# Patient Record
Sex: Female | Born: 1945 | Race: White | Hispanic: No
Health system: Midwestern US, Community
[De-identification: ages and names within clinical notes are randomized; demographics above are authoritative.]

## PROBLEM LIST (undated history)

## (undated) DIAGNOSIS — I10 Essential (primary) hypertension: Secondary | ICD-10-CM

## (undated) DIAGNOSIS — M810 Age-related osteoporosis without current pathological fracture: Secondary | ICD-10-CM

## (undated) DIAGNOSIS — Z86010 Personal history of colonic polyps: Secondary | ICD-10-CM

## (undated) DIAGNOSIS — E785 Hyperlipidemia, unspecified: Secondary | ICD-10-CM

## (undated) DIAGNOSIS — K5792 Diverticulitis of intestine, part unspecified, without perforation or abscess without bleeding: Secondary | ICD-10-CM

## (undated) DIAGNOSIS — T148XXA Other injury of unspecified body region, initial encounter: Secondary | ICD-10-CM

## (undated) DIAGNOSIS — K219 Gastro-esophageal reflux disease without esophagitis: Secondary | ICD-10-CM

## (undated) DIAGNOSIS — 1 ERRONEOUS ENCOUNTER ICD10: Secondary | ICD-10-CM

## (undated) DIAGNOSIS — Z1231 Encounter for screening mammogram for malignant neoplasm of breast: Principal | ICD-10-CM

## (undated) DIAGNOSIS — E876 Hypokalemia: Secondary | ICD-10-CM

## (undated) HISTORY — PX: ABDOMINAL HYSTERECTOMY: SHX81

## (undated) HISTORY — PX: CHOLECYSTECTOMY: SHX55

## (undated) HISTORY — PX: COLONOSCOPY WITH ESOPHAGOGASTRODUODENOSCOPY (EGD): SHX5779

## (undated) HISTORY — PX: COLONOSCOPY: SHX174

## (undated) HISTORY — PX: TUBAL LIGATION: SHX77

---

## 2016-10-22 ENCOUNTER — Encounter: Payer: Self-pay | Admitting: Emergency Medicine

## 2016-10-22 ENCOUNTER — Emergency Department
Admission: EM | Admit: 2016-10-22 | Discharge: 2016-10-22 | Disposition: A | Discharge status: Home / Self Care | Payer: Medicare HMO | Attending: Emergency Medicine | Admitting: Emergency Medicine

## 2016-10-22 DIAGNOSIS — D1721 Benign lipomatous neoplasm of skin and subcutaneous tissue of right arm: Secondary | ICD-10-CM | POA: Insufficient documentation

## 2016-10-22 DIAGNOSIS — D1720 Benign lipomatous neoplasm of skin and subcutaneous tissue of unspecified limb: Secondary | ICD-10-CM

## 2016-10-22 DIAGNOSIS — D1779 Benign lipomatous neoplasm of other sites: Secondary | ICD-10-CM

## 2016-10-22 DIAGNOSIS — R2231 Localized swelling, mass and lump, right upper limb: Secondary | ICD-10-CM | POA: Diagnosis present

## 2016-10-22 NOTE — ED Provider Notes (Signed)
[facility] Emergency Department Provider Note  ____________________________________________   None    (approximate)  I have reviewed the triage vital signs and the nursing notes.   HISTORY  Chief Complaint Joint Swelling  HPI Kathleen Farmer is a 70 y.o. female who has a lump on her right proximal arm that has been there now for a couple months. Patient states that she has moved here, and does not have a doctor, but she was concerned because it had not gone away. She does not have any pain in the area and she is able to move her arm without difficulty. She also denies any numbness or tingling.   No past medical history on file.  There are no active problems to display for this patient.   No past surgical history on file.  Prior to Admission medications   Not on File    Allergies Ampicillin  No family history on file.  Social History Social History  Substance Use Topics  . Smoking status: Never Smoker  . Smokeless tobacco: Never Used  . Alcohol use No    Review of Systems Constitutional: No fever/chills Eyes: No visual changes. ENT: No sore throat. Cardiovascular: Denies chest pain. Respiratory: Denies shortness of breath. Gastrointestinal: No abdominal pain.  No nausea, no vomiting.  No diarrhea.  No constipation. Genitourinary: Negative for dysuria. Musculoskeletal: Negative for back pain. Skin: Negative for rash.Positive for a "lump" on her right proximal arm Neurological: Negative for headaches, focal weakness or numbness.  10-point ROS otherwise negative.  ____________________________________________   PHYSICAL EXAM:  VITAL SIGNS: ED Triage Vitals  Enc Vitals Group     BP 10/22/16 0715 (!) 154/92     Pulse Rate 10/22/16 0715 (!) 101     Resp 10/22/16 0715 16     Temp 10/22/16 0715 97.4 F (36.3 C)     Temp Source 10/22/16 0715 Oral     SpO2 10/22/16 0715 95 %     Weight 10/22/16 0710 129 lb (58.5 kg)     Height  10/22/16 0710 5' 1.25" (1.556 m)     Head Circumference --      Peak Flow --      Pain Score 10/22/16 0710 0     Pain Loc --      Pain Edu? --      Excl. in [place]? --     Constitutional: Alert and oriented. Well appearing and in no acute distress. Eyes: Conjunctivae are normal. PERRL. EOMI. Head: Atraumatic. Nose: No congestion/rhinnorhea. Mouth/Throat: Mucous membranes are moist.  Oropharynx non-erythematous. Neck: No stridor.   Cardiovascular: Normal rate, regular rhythm. Grossly normal heart sounds.  Good peripheral circulation. Respiratory: Normal respiratory effort.  No retractions. Lungs CTAB. Gastrointestinal: Soft and nontender. No distention. No abdominal bruits. No CVA tenderness. Musculoskeletal: No lower extremity tenderness nor edema.  No joint effusions. Patient with a 2 x 3 cm soft tissue mass right proximal lateral arm which appears to be a lipoma. There is no surrounding swelling or redness or warmth. Neurologic:  Normal speech and language. No gross focal neurologic deficits are appreciated. No gait instability. Skin:  Skin is warm, dry and intact. No rash noted. Psychiatric: Mood and affect are normal. Speech and behavior are normal.  ____________________________________________   LABS (all labs ordered are listed, but only abnormal results are displayed)  Labs Reviewed - No data to display ____________________________________________  EKG None  ____________________________________________  RADIOLOGY  None ____________________________________________   PROCEDURES  Procedure(s) performed: None  Procedures  Critical Care performed: No  ____________________________________________   INITIAL IMPRESSION / ASSESSMENT AND PLAN / ED COURSE  Pertinent labs & imaging results that were available during my care of the patient were reviewed by me and considered in my medical decision making (see chart for details).  Pt will be referred to general surgery  to have her lipoma removed if she desires.  Pt was told to otherwise follow up with primary care.  Clinical Course      ____________________________________________   FINAL CLINICAL IMPRESSION(S) / ED DIAGNOSES  Final diagnoses:  Lipoma of extremity      NEW MEDICATIONS STARTED DURING THIS VISIT:  New Prescriptions   No medications on file     Note:  This document was prepared using Dragon voice recognition software and may include unintentional dictation errors.    Ruby Cola, MD 10/22/16 [phone]

## 2016-10-22 NOTE — ED Triage Notes (Signed)
Patient presents to the ED with swollen area to her right shoulder x 2 months.  Patient denies pain at this time.  Patient states, "I thought this lump was from exercising but it's not going away."

## 2017-08-08 ENCOUNTER — Encounter: Payer: Self-pay | Admitting: *Deleted

## 2017-08-09 ENCOUNTER — Encounter
Admission: RE | Disposition: A | Discharge status: Home / Self Care | Payer: Self-pay | Source: Ambulatory Visit | Attending: Unknown Physician Specialty

## 2017-08-09 ENCOUNTER — Ambulatory Visit: Payer: Medicare HMO | Admitting: Anesthesiology

## 2017-08-09 ENCOUNTER — Encounter: Payer: Self-pay | Admitting: *Deleted

## 2017-08-09 ENCOUNTER — Ambulatory Visit
Admission: RE | Admit: 2017-08-09 | Discharge: 2017-08-09 | Disposition: A | Discharge status: Home / Self Care | Payer: Medicare HMO | Source: Ambulatory Visit | Attending: Unknown Physician Specialty | Admitting: Unknown Physician Specialty

## 2017-08-09 DIAGNOSIS — K219 Gastro-esophageal reflux disease without esophagitis: Secondary | ICD-10-CM | POA: Insufficient documentation

## 2017-08-09 DIAGNOSIS — Z79899 Other long term (current) drug therapy: Secondary | ICD-10-CM | POA: Insufficient documentation

## 2017-08-09 DIAGNOSIS — Z881 Allergy status to other antibiotic agents status: Secondary | ICD-10-CM | POA: Insufficient documentation

## 2017-08-09 DIAGNOSIS — Z90710 Acquired absence of both cervix and uterus: Secondary | ICD-10-CM | POA: Diagnosis not present

## 2017-08-09 DIAGNOSIS — D122 Benign neoplasm of ascending colon: Secondary | ICD-10-CM | POA: Insufficient documentation

## 2017-08-09 DIAGNOSIS — Z9049 Acquired absence of other specified parts of digestive tract: Secondary | ICD-10-CM | POA: Insufficient documentation

## 2017-08-09 DIAGNOSIS — M810 Age-related osteoporosis without current pathological fracture: Secondary | ICD-10-CM | POA: Insufficient documentation

## 2017-08-09 DIAGNOSIS — Z1211 Encounter for screening for malignant neoplasm of colon: Secondary | ICD-10-CM | POA: Diagnosis present

## 2017-08-09 DIAGNOSIS — D124 Benign neoplasm of descending colon: Secondary | ICD-10-CM | POA: Diagnosis not present

## 2017-08-09 DIAGNOSIS — D123 Benign neoplasm of transverse colon: Secondary | ICD-10-CM | POA: Insufficient documentation

## 2017-08-09 DIAGNOSIS — Z800 Family history of malignant neoplasm of digestive organs: Secondary | ICD-10-CM | POA: Diagnosis not present

## 2017-08-09 DIAGNOSIS — Z791 Long term (current) use of non-steroidal anti-inflammatories (NSAID): Secondary | ICD-10-CM | POA: Diagnosis not present

## 2017-08-09 DIAGNOSIS — Z86010 Personal history of colonic polyps: Secondary | ICD-10-CM | POA: Diagnosis not present

## 2017-08-09 HISTORY — DX: Age-related osteoporosis without current pathological fracture: M81.0

## 2017-08-09 HISTORY — DX: Other injury of unspecified body region, initial encounter: T14.8XXA

## 2017-08-09 HISTORY — DX: Personal history of colonic polyps: Z86.010

## 2017-08-09 HISTORY — PX: COLONOSCOPY WITH PROPOFOL: SHX5780

## 2017-08-09 HISTORY — DX: Gastro-esophageal reflux disease without esophagitis: K21.9

## 2017-08-09 SURGERY — COLONOSCOPY WITH PROPOFOL
Anesthesia: General

## 2017-08-09 MED ORDER — PROPOFOL 10 MG/ML IV BOLUS: INTRAVENOUS | Status: DC | PRN

## 2017-08-09 MED ORDER — SODIUM CHLORIDE 0.9 % IV SOLN
INTRAVENOUS | Status: DC
  Administered 2017-08-09: 14:00:00 via INTRAVENOUS

## 2017-08-09 MED ORDER — PROPOFOL 10 MG/ML IV BOLUS
INTRAVENOUS | Status: AC
  Filled 2017-08-09: qty 20

## 2017-08-09 MED ORDER — PROPOFOL 500 MG/50ML IV EMUL
INTRAVENOUS | Status: AC
  Filled 2017-08-09: qty 50

## 2017-08-09 MED ORDER — PROPOFOL 500 MG/50ML IV EMUL
INTRAVENOUS | Status: DC | PRN
  Administered 2017-08-09: 120 ug/kg/min via INTRAVENOUS

## 2017-08-09 MED ORDER — LIDOCAINE HCL (PF) 2 % IJ SOLN
INTRAMUSCULAR | Status: AC
Start: 2017-08-09 — End: 2017-08-09
  Filled 2017-08-09: qty 2

## 2017-08-09 MED ORDER — SODIUM CHLORIDE 0.9 % IV SOLN: INTRAVENOUS | Status: DC

## 2017-08-09 MED ORDER — PROPOFOL 10 MG/ML IV BOLUS
INTRAVENOUS | Status: DC | PRN
  Administered 2017-08-09: 60 mg via INTRAVENOUS

## 2017-08-09 NOTE — Op Note (Signed)
[facility] Patient Name: Kathleen Farmer Procedure Date: 08/09/2017 2:54 PM MRN: [id number] Account #: [id number] Date of Birth: 08/24/1946 Admit Type: Outpatient Age: 71 Room: [facility] ENDO ROOM 3 Gender: Female Note Status: Finalized Procedure:            Colonoscopy Indications:          Family history of colon cancer in a first-degree                        relative Providers:            Manya Silvas, MD Referring MD:         Dion Body (Referring MD) Medicines:            Propofol per Anesthesia Complications:        No immediate complications. Procedure:            Pre-Anesthesia Assessment:                       - After reviewing the risks and benefits, the patient                        was deemed in satisfactory condition to undergo the                        procedure.                       After obtaining informed consent, the colonoscope was                        passed under direct vision. Throughout the procedure,                        the patient's blood pressure, pulse, and oxygen                        saturations were monitored continuously. The                        Colonoscope was introduced through the anus and                        advanced to the the cecum, identified by appendiceal                        orifice and ileocecal valve. The colonoscopy was                        performed without difficulty. The patient tolerated the                        procedure well. The quality of the bowel preparation                        was excellent. Findings:      A small polyp was found in the ascending colon. The polyp was sessile.       The polyp was removed with a hot snare. Resection and retrieval were       complete. To prevent bleeding after the polypectomy, one hemostatic clip  was successfully placed. There was no bleeding at the end of the       procedure.      A small polyp was found in the  transverse colon. The polyp was sessile.       The polyp was removed with a hot snare. Resection and retrieval were       complete. To prevent bleeding after the polypectomy, one hemostatic clip       was successfully placed. There was no bleeding during, or at the end, of       the procedure.      A small polyp was found in the descending colon. The polyp was sessile.       The polyp was removed with a hot snare. Resection and retrieval were       complete. To stop active bleeding, two hemostatic clips were       successfully placed. There was no bleeding at the end of the procedure. Impression:           - One small polyp in the ascending colon, removed with                        a hot snare. Resected and retrieved. Clip was placed.                       - One small polyp in the transverse colon, removed with                        a hot snare. Resected and retrieved. Clip was placed.                       - One small polyp in the descending colon, removed with                        a hot snare. Resected and retrieved. Clips were placed. Recommendation:       - Await pathology results. Manya Silvas, MD 08/09/2017 3:29:55 PM This report has been signed electronically. Number of Addenda: 0 Note Initiated On: 08/09/2017 2:54 PM Scope Withdrawal Time: 0 hours 19 minutes 58 seconds  Total Procedure Duration: 0 hours 24 minutes 51 seconds       [facility]

## 2017-08-09 NOTE — H&P (Signed)
   Primary Care Physician:  Dion Body, MD Primary Gastroenterologist:  Dr. Vira Agar  Pre-Procedure History & Physical: HPI:  Kathleen Farmer is a 71 y.o. female is here for an colonoscopy.   Past Medical History:  Diagnosis Date  . Fx   . GERD (gastroesophageal reflux disease)   . Hx of colonic polyps   . Osteoporosis     Past Surgical History:  Procedure Laterality Date  . ABDOMINAL HYSTERECTOMY    . CHOLECYSTECTOMY    . COLONOSCOPY    . COLONOSCOPY WITH ESOPHAGOGASTRODUODENOSCOPY (EGD)      Prior to Admission medications   Medication Sig Start Date End Date Taking? Authorizing Provider  Ca Cit Malate-Cholecalciferol (CALCIUM CITRATE MALATE-VIT D PO) Take 1,200 mg by mouth.   Yes [provider]  lansoprazole (PREVACID) 15 MG capsule Take 15 mg by mouth daily at 12 noon.   Yes [provider]  alendronate (FOSAMAX) 70 MG tablet Take 70 mg by mouth once a week. Take with a full glass of water on an empty stomach.    [provider]  meloxicam (MOBIC) 15 MG tablet Take 15 mg by mouth daily.    [provider]    Allergies as of 05/09/2017 - never reviewed  Allergen Reaction Noted  . Ampicillin Other (See Comments) 10/22/2016    History reviewed. No pertinent family history.  Social History   Social History  . Marital status: Married    Spouse name: N/A  . Number of children: N/A  . Years of education: N/A   Occupational History  . Not on file.   Social History Main Topics  . Smoking status: Never Smoker  . Smokeless tobacco: Never Used  . Alcohol use No  . Drug use: No  . Sexual activity: Not on file   Other Topics Concern  . Not on file   Social History Narrative  . No narrative on file    Review of Systems: See HPI, otherwise negative ROS  Physical Exam: BP 140/85   Pulse 99   Temp (!) 96.4 F (35.8 C) (Tympanic)   Resp 18   Ht 5\' 1"  (1.549 m)   Wt 63 kg (139 lb)   LMP  (LMP Unknown)   SpO2 100%    BMI 26.26 kg/m  General:   Alert,  pleasant and cooperative in NAD Head:  Normocephalic and atraumatic. Neck:  Supple; no masses or thyromegaly. Lungs:  Clear throughout to auscultation.    Heart:  Regular rate and rhythm. Abdomen:  Soft, nontender and nondistended. Normal bowel sounds, without guarding, and without rebound.   Neurologic:  Alert and  oriented x4;  grossly normal neurologically.  Impression/Plan: Kathleen Farmer is here for an colonoscopy to be performed for Family history of colon cancer in father and sister.  Risks, benefits, limitations, and alternatives regarding  colonoscopy have been reviewed with the patient.  Questions have been answered.  All parties agreeable.   Gaylyn Cheers, MD  08/09/2017, 2:54 PM

## 2017-08-09 NOTE — Anesthesia Preprocedure Evaluation (Signed)
Anesthesia Evaluation  Patient identified by MRN, date of birth, ID band Patient awake    Reviewed: Allergy & Precautions, NPO status , Patient's Chart, lab work & pertinent test results  Airway Mallampati: II       Dental  (+) Upper Dentures, Lower Dentures   Pulmonary neg pulmonary ROS,    breath sounds clear to auscultation       Cardiovascular Exercise Tolerance: Good  Rhythm:Regular     Neuro/Psych negative neurological ROS  negative psych ROS   GI/Hepatic Neg liver ROS, PUD, GERD  Medicated,  Endo/Other  negative endocrine ROS  Renal/GU negative Renal ROS     Musculoskeletal negative musculoskeletal ROS (+)   Abdominal Normal abdominal exam  (+)   Peds negative pediatric ROS (+)  Hematology negative hematology ROS (+)   Anesthesia Other Findings   Reproductive/Obstetrics negative OB ROS                             Anesthesia Physical Anesthesia Plan  ASA: II  Anesthesia Plan: General   Post-op Pain Management:    Induction: Intravenous  PONV Risk Score and Plan: 0  Airway Management Planned: Natural Airway and Nasal Cannula  Additional Equipment:   Intra-op Plan:   Post-operative Plan:   Informed Consent: I have reviewed the patients History and Physical, chart, labs and discussed the procedure including the risks, benefits and alternatives for the proposed anesthesia with the patient or authorized representative who has indicated his/her understanding and acceptance.     Plan Discussed with: CRNA  Anesthesia Plan Comments:         Anesthesia Quick Evaluation

## 2017-08-09 NOTE — Transfer of Care (Signed)
Immediate Anesthesia Transfer of Care Note  Patient: Kathleen Farmer  Procedure(s) Performed: Procedure(s): COLONOSCOPY WITH PROPOFOL (N/A)  Patient Location: PACU  Anesthesia Type:General  Level of Consciousness: sedated  Airway & Oxygen Therapy: Patient Spontanous Breathing and Patient connected to nasal cannula oxygen  Post-op Assessment: Report given to RN and Post -op Vital signs reviewed and stable  Post vital signs: Reviewed and stable  Last Vitals:  Vitals:   08/09/17 1329 08/09/17 1528  BP: 140/85 99/62  Pulse: 99 62  Resp: 18 16  Temp: (!) 35.8 C (!) 36 C  SpO2: 100% 99    Last Pain:  Vitals:   08/09/17 1528  TempSrc: Tympanic         Complications: No apparent anesthesia complications

## 2017-08-09 NOTE — Anesthesia Postprocedure Evaluation (Signed)
Anesthesia Post Note  Patient: Kathleen Farmer  Procedure(s) Performed: Procedure(s) (LRB): COLONOSCOPY WITH PROPOFOL (N/A)  Patient location during evaluation: PACU Anesthesia Type: General Level of consciousness: awake and alert and oriented Pain management: pain level controlled Vital Signs Assessment: post-procedure vital signs reviewed and stable Respiratory status: spontaneous breathing Cardiovascular status: blood pressure returned to baseline Anesthetic complications: no     Last Vitals:  Vitals:   08/09/17 1538 08/09/17 1558  BP: 113/76 138/74  Pulse:    Resp:  16  Temp:    SpO2:      Last Pain:  Vitals:   08/09/17 1528  TempSrc: Tympanic                 [name]

## 2017-08-09 NOTE — Anesthesia Post-op Follow-up Note (Signed)
Anesthesia QCDR form completed.        

## 2017-08-10 ENCOUNTER — Encounter: Payer: Self-pay | Admitting: Unknown Physician Specialty

## 2017-08-12 LAB — SURGICAL PATHOLOGY

## 2017-08-15 ENCOUNTER — Encounter: Payer: Self-pay | Admitting: Unknown Physician Specialty

## 2018-08-22 ENCOUNTER — Other Ambulatory Visit: Payer: Self-pay | Admitting: Family Medicine

## 2018-08-22 DIAGNOSIS — Z1231 Encounter for screening mammogram for malignant neoplasm of breast: Secondary | ICD-10-CM

## 2018-09-04 ENCOUNTER — Ambulatory Visit
Admission: RE | Admit: 2018-09-04 | Discharge: 2018-09-04 | Disposition: A | Discharge status: Home / Self Care | Payer: Medicare HMO | Source: Ambulatory Visit | Attending: Family Medicine | Admitting: Family Medicine

## 2018-09-04 DIAGNOSIS — Z1231 Encounter for screening mammogram for malignant neoplasm of breast: Secondary | ICD-10-CM | POA: Diagnosis present

## 2018-09-14 ENCOUNTER — Inpatient Hospital Stay
Admission: RE | Admit: 2018-09-14 | Discharge: 2018-09-14 | Disposition: A | Discharge status: Home / Self Care | Payer: Self-pay | Source: Ambulatory Visit | Attending: *Deleted | Admitting: *Deleted

## 2018-09-14 ENCOUNTER — Other Ambulatory Visit: Payer: Self-pay | Admitting: *Deleted

## 2018-09-14 DIAGNOSIS — Z9289 Personal history of other medical treatment: Secondary | ICD-10-CM

## 2019-07-22 ENCOUNTER — Other Ambulatory Visit: Payer: Self-pay | Admitting: Family Medicine

## 2019-07-22 DIAGNOSIS — Z1231 Encounter for screening mammogram for malignant neoplasm of breast: Secondary | ICD-10-CM

## 2019-09-06 ENCOUNTER — Ambulatory Visit
Admission: RE | Admit: 2019-09-06 | Discharge: 2019-09-06 | Disposition: A | Discharge status: Home / Self Care | Payer: Medicare HMO | Source: Ambulatory Visit | Attending: Family Medicine | Admitting: Family Medicine

## 2019-09-06 DIAGNOSIS — Z1231 Encounter for screening mammogram for malignant neoplasm of breast: Secondary | ICD-10-CM

## 2020-02-10 IMAGING — MG MM DIGITAL SCREENING BILAT W/ TOMO W/ CAD
8 series · 8 of 24 positions shown · non-contrast
Comparison: Previous exam(s).

CLINICAL DATA: Screening.

EXAM:
DIGITAL SCREENING BILATERAL MAMMOGRAM WITH TOMO AND CAD

[R MLO synth-2D]
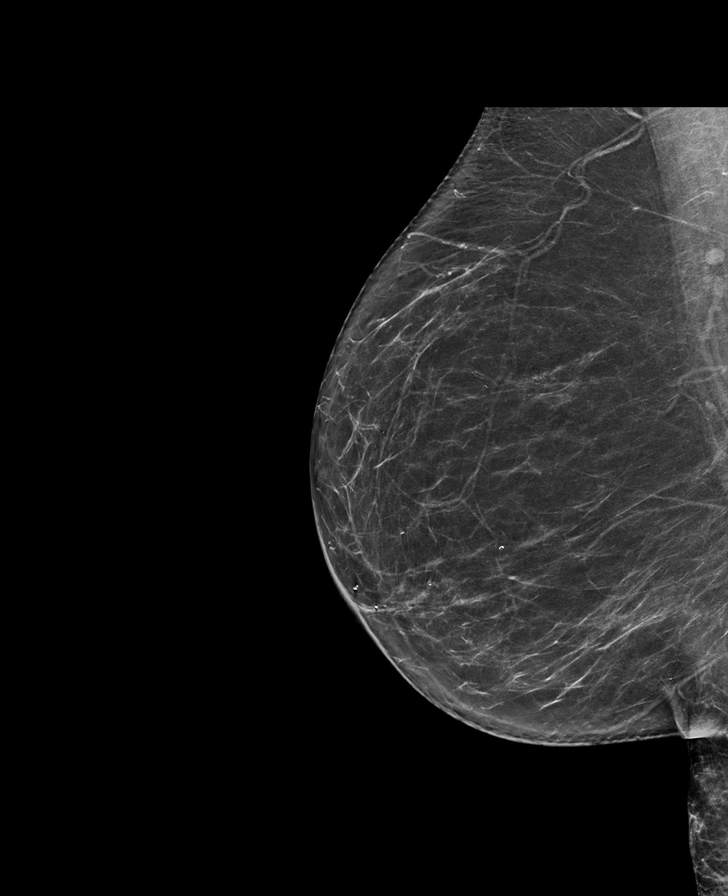

[R CC synth-2D]
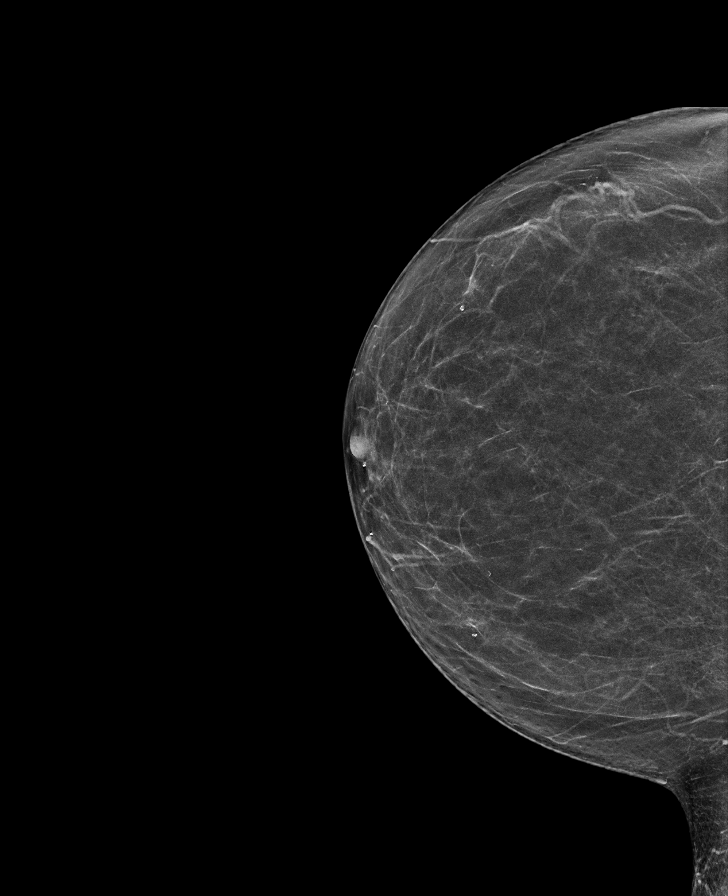

[L CC synth-2D]
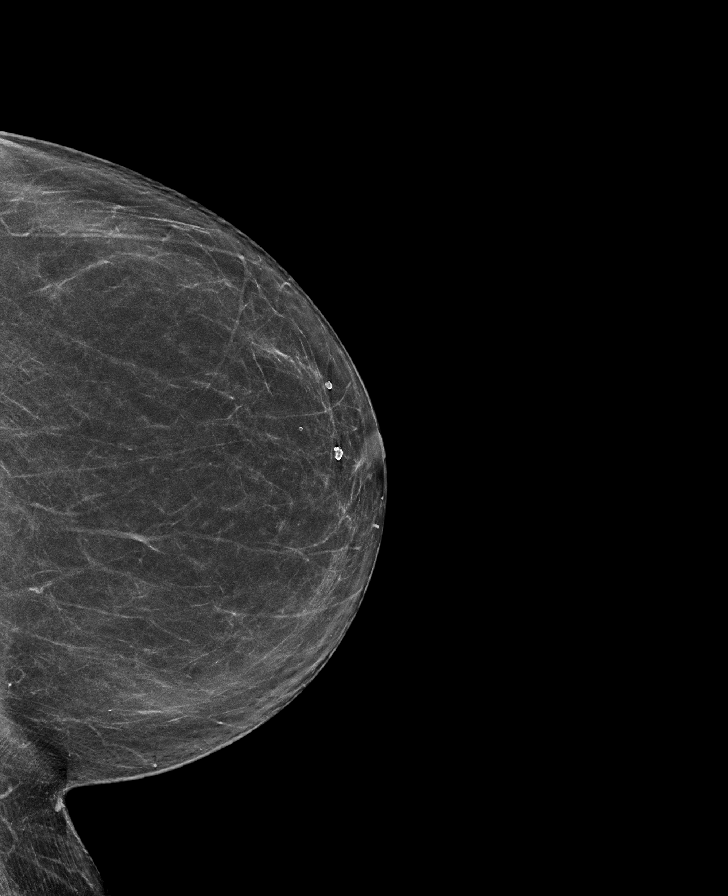

[L MLO synth-2D]
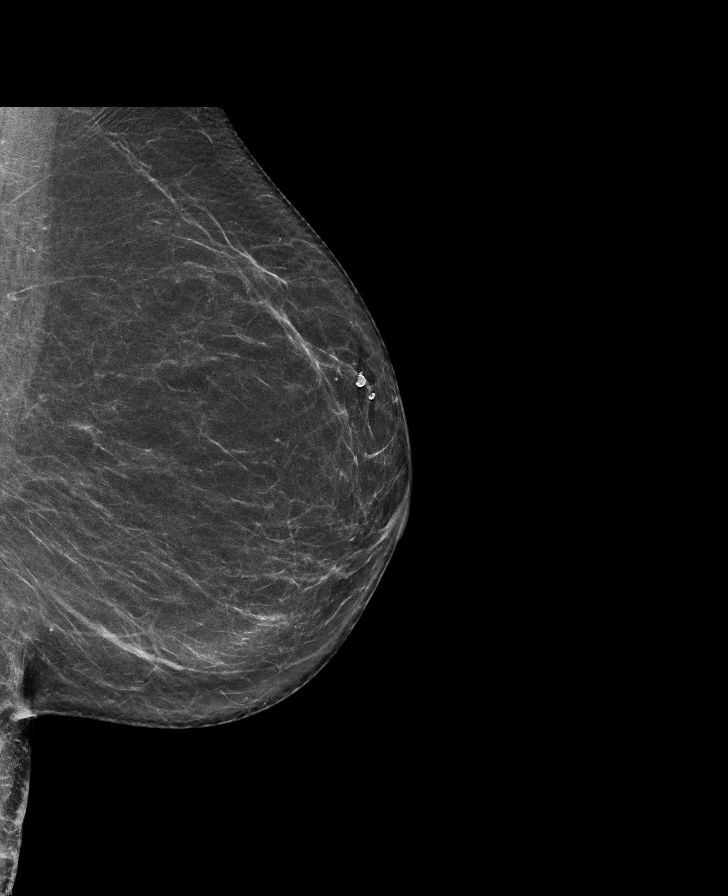

[R MLO tomo · tomo slice 38/75.0]
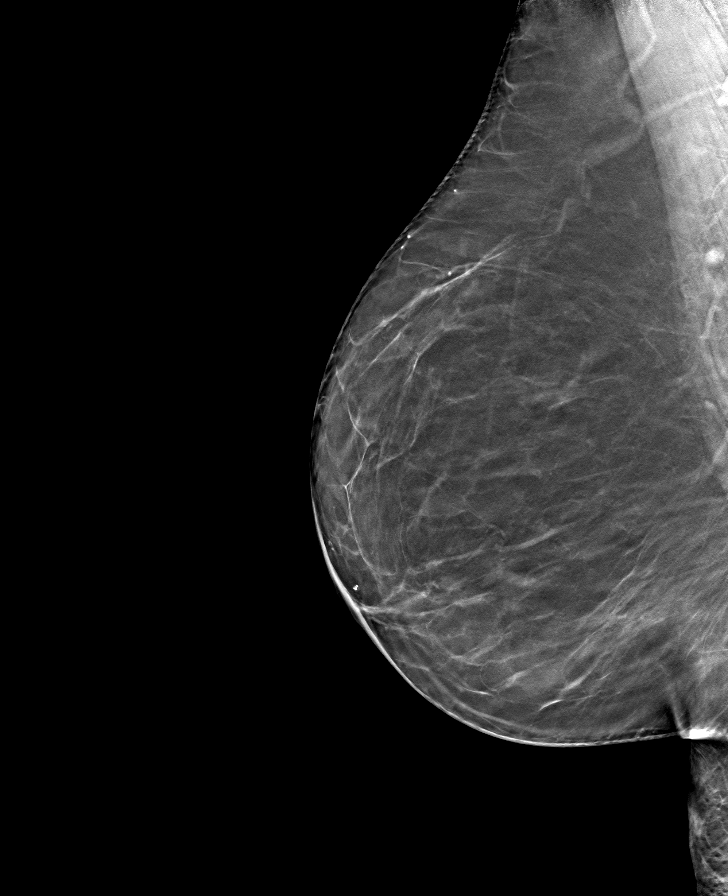

[L CC tomo · tomo slice 34/67.0]
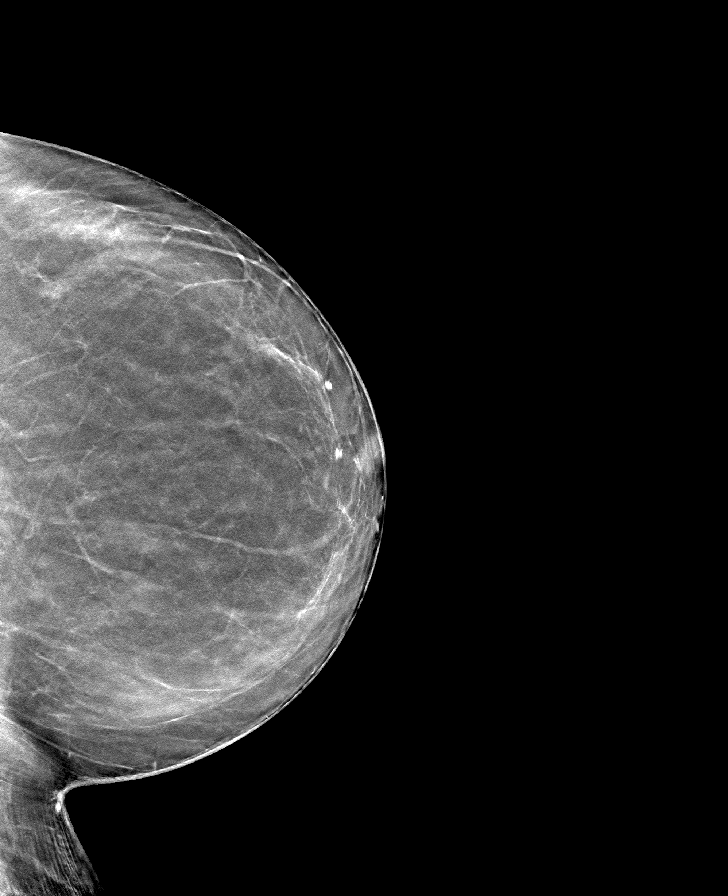

[R CC tomo · tomo slice 32/63.0]
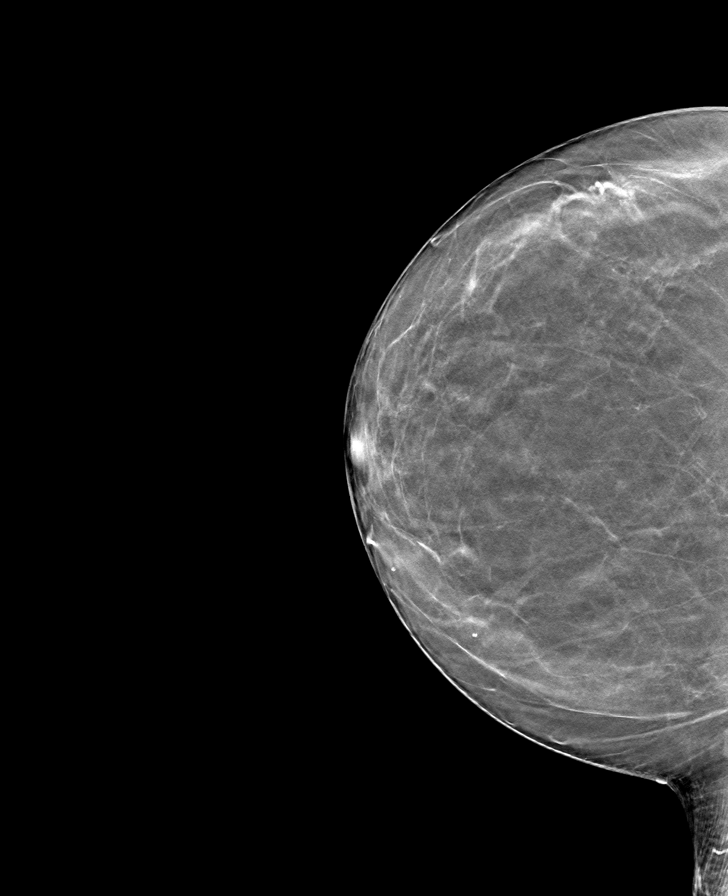

[L MLO tomo · tomo slice 37/72.0]
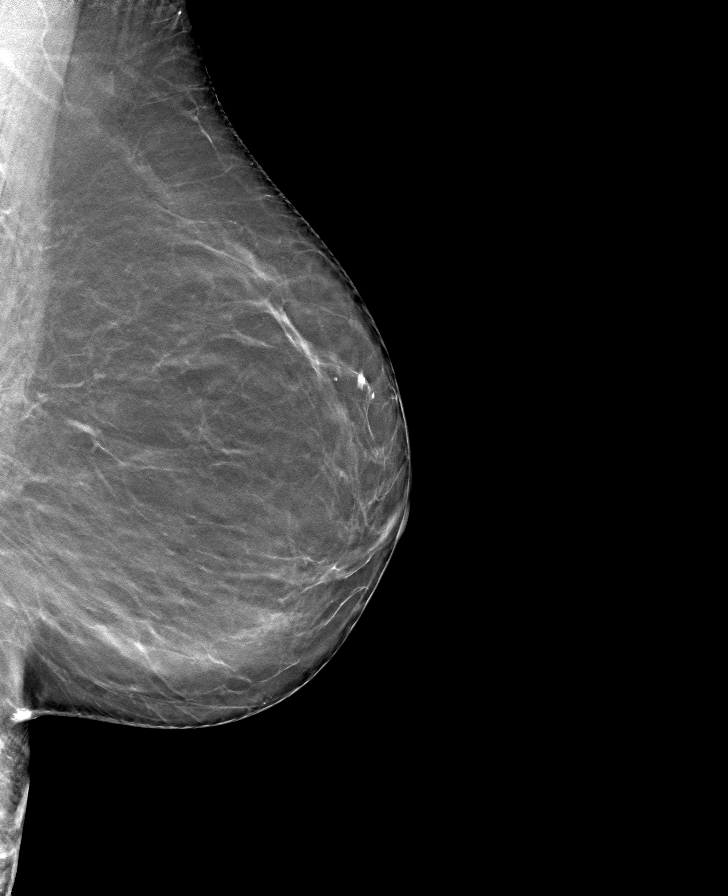

[8 of 24 positions shown; findings below may reference images not displayed]

ACR Breast Density Category b: There are scattered areas of
fibroglandular density.
FINDINGS: There are no findings suspicious for malignancy. Images were
processed with CAD.
IMPRESSION: No mammographic evidence of malignancy. A result letter of this
screening mammogram will be mailed directly to the patient.

RECOMMENDATION:
Screening mammogram in one year. (Code:[id number]-775)

BI-RADS CATEGORY  1: Negative.

## 2020-07-23 ENCOUNTER — Other Ambulatory Visit: Payer: Self-pay | Admitting: Family Medicine

## 2020-07-23 DIAGNOSIS — Z1231 Encounter for screening mammogram for malignant neoplasm of breast: Secondary | ICD-10-CM

## 2020-09-07 ENCOUNTER — Other Ambulatory Visit: Payer: Self-pay

## 2020-09-07 ENCOUNTER — Ambulatory Visit
Admission: RE | Admit: 2020-09-07 | Discharge: 2020-09-07 | Disposition: A | Discharge status: Home / Self Care | Payer: Medicare HMO | Source: Ambulatory Visit | Attending: Family Medicine | Admitting: Family Medicine

## 2020-09-07 DIAGNOSIS — Z1231 Encounter for screening mammogram for malignant neoplasm of breast: Secondary | ICD-10-CM | POA: Diagnosis present

## 2021-01-15 ENCOUNTER — Other Ambulatory Visit: Payer: Self-pay

## 2021-01-15 ENCOUNTER — Other Ambulatory Visit
Admission: RE | Admit: 2021-01-15 | Discharge: 2021-01-15 | Disposition: A | Discharge status: Home / Self Care | Payer: Medicare HMO | Source: Ambulatory Visit | Attending: Gastroenterology | Admitting: Gastroenterology

## 2021-01-15 DIAGNOSIS — Z01812 Encounter for preprocedural laboratory examination: Secondary | ICD-10-CM | POA: Diagnosis present

## 2021-01-15 DIAGNOSIS — Z20822 Contact with and (suspected) exposure to covid-19: Secondary | ICD-10-CM | POA: Insufficient documentation

## 2021-01-15 LAB — SARS CORONAVIRUS 2 (TAT 6-24 HRS): SARS Coronavirus 2: NEGATIVE

## 2021-01-19 ENCOUNTER — Encounter: Payer: Self-pay | Admitting: *Deleted

## 2021-01-19 ENCOUNTER — Ambulatory Visit: Payer: Medicare HMO | Admitting: Certified Registered Nurse Anesthetist

## 2021-01-19 ENCOUNTER — Encounter
Admission: RE | Disposition: A | Discharge status: Home / Self Care | Payer: Self-pay | Source: Home / Self Care | Attending: Gastroenterology

## 2021-01-19 ENCOUNTER — Ambulatory Visit
Admission: RE | Admit: 2021-01-19 | Discharge: 2021-01-19 | Disposition: A | Discharge status: Home / Self Care | Payer: Medicare HMO | Attending: Gastroenterology | Admitting: Gastroenterology

## 2021-01-19 ENCOUNTER — Other Ambulatory Visit: Payer: Self-pay

## 2021-01-19 DIAGNOSIS — Z86010 Personal history of colonic polyps: Secondary | ICD-10-CM | POA: Insufficient documentation

## 2021-01-19 DIAGNOSIS — Z800 Family history of malignant neoplasm of digestive organs: Secondary | ICD-10-CM | POA: Diagnosis not present

## 2021-01-19 DIAGNOSIS — Z880 Allergy status to penicillin: Secondary | ICD-10-CM | POA: Insufficient documentation

## 2021-01-19 DIAGNOSIS — Z791 Long term (current) use of non-steroidal anti-inflammatories (NSAID): Secondary | ICD-10-CM | POA: Diagnosis not present

## 2021-01-19 DIAGNOSIS — Z1211 Encounter for screening for malignant neoplasm of colon: Secondary | ICD-10-CM | POA: Diagnosis not present

## 2021-01-19 DIAGNOSIS — Z79899 Other long term (current) drug therapy: Secondary | ICD-10-CM | POA: Diagnosis not present

## 2021-01-19 DIAGNOSIS — K5730 Diverticulosis of large intestine without perforation or abscess without bleeding: Secondary | ICD-10-CM | POA: Insufficient documentation

## 2021-01-19 DIAGNOSIS — K640 First degree hemorrhoids: Secondary | ICD-10-CM | POA: Diagnosis not present

## 2021-01-19 HISTORY — PX: COLONOSCOPY WITH PROPOFOL: SHX5780

## 2021-01-19 HISTORY — DX: Essential (primary) hypertension: I10

## 2021-01-19 HISTORY — DX: Diverticulitis of intestine, part unspecified, without perforation or abscess without bleeding: K57.92

## 2021-01-19 HISTORY — DX: Hyperlipidemia, unspecified: E78.5

## 2021-01-19 SURGERY — COLONOSCOPY WITH PROPOFOL
Anesthesia: General

## 2021-01-19 MED ORDER — PROPOFOL 10 MG/ML IV BOLUS
INTRAVENOUS | Status: DC | PRN
  Administered 2021-01-19: 40 mg via INTRAVENOUS

## 2021-01-19 MED ORDER — PROPOFOL 500 MG/50ML IV EMUL
INTRAVENOUS | Status: DC | PRN
  Administered 2021-01-19: 125 ug/kg/min via INTRAVENOUS

## 2021-01-19 MED ORDER — SODIUM CHLORIDE 0.9 % IV SOLN: INTRAVENOUS | Status: DC

## 2021-01-19 MED ORDER — PHENYLEPHRINE HCL (PRESSORS) 10 MG/ML IV SOLN
INTRAVENOUS | Status: DC | PRN
  Administered 2021-01-19: 100 ug via INTRAVENOUS

## 2021-01-19 MED ORDER — PROPOFOL 10 MG/ML IV BOLUS
INTRAVENOUS | Status: AC
  Filled 2021-01-19: qty 20

## 2021-01-19 MED ORDER — PROPOFOL 500 MG/50ML IV EMUL
INTRAVENOUS | Status: AC
  Filled 2021-01-19: qty 50

## 2021-01-19 MED ORDER — LIDOCAINE HCL (CARDIAC) PF 100 MG/5ML IV SOSY
PREFILLED_SYRINGE | INTRAVENOUS | Status: DC | PRN
  Administered 2021-01-19: 50 mg via INTRAVENOUS

## 2021-01-19 NOTE — H&P (Signed)
Outpatient short stay form Pre-procedure 01/19/2021 10:16 AM Kathleen Miyamoto MD, MPH  Primary Physician: Dr. Netty Starring  Reason for visit:  Surveillance  History of present illness:   75 y/o lady with history of adenomatous polyps found three years ago here for surveillance colonoscopy. Father had colon cancer at around 23. History of hysterectomy but no other GI surgeries. No blood thinners. No new GI symptoms.    Current Facility-Administered Medications:  .  0.9 %  sodium chloride infusion, , Intravenous, Continuous, Teela Narducci, Hilton Cork, MD, Last Rate: 20 mL/hr at 01/19/21 1016, New Bag at 01/19/21 1016  Medications Prior to Admission  Medication Sig Dispense Refill Last Dose  . Ca Cit Malate-Cholecalciferol (CALCIUM CITRATE MALATE-VIT D PO) Take 1,200 mg by mouth.   01/18/2021 at 0800  . ezetimibe (ZETIA) 10 MG tablet Take 10 mg by mouth daily.   01/18/2021 at 0800  . lansoprazole (PREVACID) 15 MG capsule Take 15 mg by mouth daily at 12 noon.   01/18/2021 at Unknown time  . meloxicam (MOBIC) 15 MG tablet Take 15 mg by mouth daily.   Past Week at Unknown time  . omeprazole (PRILOSEC) 20 MG capsule Take 20 mg by mouth daily.   01/18/2021 at 0800  . alendronate (FOSAMAX) 70 MG tablet Take 70 mg by mouth once a week. Take with a full glass of water on an empty stomach. (Patient not taking: Reported on 01/19/2021)   Completed Course at Unknown time     Allergies  Allergen Reactions  . Ampicillin Other (See Comments)    unknown     Past Medical History:  Diagnosis Date  . Diverticulitis   . Fx   . GERD (gastroesophageal reflux disease)   . Hx of colonic polyps   . Hyperlipidemia   . Hypertension   . Osteoporosis   . Osteoporosis     Review of systems:  Otherwise negative.    Physical Exam  Gen: Alert, oriented. Appears stated age.  HEENT: PERRLA. Lungs: No respiratory distress CV: RRR Abd: soft, benign, no masses Ext: No edema    Planned procedures: Proceed with  colonoscopy. The patient understands the nature of the planned procedure, indications, risks, alternatives and potential complications including but not limited to bleeding, infection, perforation, damage to internal organs and possible oversedation/side effects from anesthesia. The patient agrees and gives consent to proceed.  Please refer to procedure notes for findings, recommendations and patient disposition/instructions.     Kathleen Miyamoto MD, MPH Gastroenterology 01/19/2021  10:16 AM

## 2021-01-19 NOTE — Anesthesia Preprocedure Evaluation (Signed)
Anesthesia Evaluation  Patient identified by MRN, date of birth, ID band Patient awake    Reviewed: Allergy & Precautions, H&P , NPO status , Patient's Chart, lab work & pertinent test results  History of Anesthesia Complications Negative for: history of anesthetic complications  Airway Mallampati: II       Dental   Pulmonary neg pulmonary ROS, neg sleep apnea, neg COPD,    breath sounds clear to auscultation       Cardiovascular hypertension, (-) angina(-) Past MI and (-) Cardiac Stents (-) dysrhythmias  Rhythm:regular     Neuro/Psych negative neurological ROS  negative psych ROS   GI/Hepatic Neg liver ROS, GERD  Controlled,  Endo/Other  negative endocrine ROS  Renal/GU negative Renal ROS  negative genitourinary   Musculoskeletal   Abdominal   Peds  Hematology negative hematology ROS (+)   Anesthesia Other Findings Past Medical History: No date: Diverticulitis No date: Fx No date: GERD (gastroesophageal reflux disease) No date: Hx of colonic polyps No date: Hyperlipidemia No date: Hypertension No date: Osteoporosis No date: Osteoporosis  Past Surgical History: No date: ABDOMINAL HYSTERECTOMY No date: CHOLECYSTECTOMY No date: COLONOSCOPY No date: COLONOSCOPY WITH ESOPHAGOGASTRODUODENOSCOPY (EGD) 08/09/2017: COLONOSCOPY WITH PROPOFOL; N/A     Comment:  Procedure: COLONOSCOPY WITH PROPOFOL;  Surgeon: Manya Silvas, MD;  Location: [facility] ENDOSCOPY;  Service:               Endoscopy;  Laterality: N/A; No date: TUBAL LIGATION  BMI    Body Mass Index: 25.11 kg/m      Reproductive/Obstetrics negative OB ROS                             Anesthesia Physical Anesthesia Plan  ASA: II  Anesthesia Plan: General   Post-op Pain Management:    Induction:   PONV Risk Score and Plan: Propofol infusion and TIVA  Airway Management Planned: Nasal Cannula  Additional  Equipment:   Intra-op Plan:   Post-operative Plan:   Informed Consent: I have reviewed the patients History and Physical, chart, labs and discussed the procedure including the risks, benefits and alternatives for the proposed anesthesia with the patient or authorized representative who has indicated his/her understanding and acceptance.     Dental Advisory Given  Plan Discussed with: Anesthesiologist, CRNA and Surgeon  Anesthesia Plan Comments:         Anesthesia Quick Evaluation

## 2021-01-19 NOTE — Interval H&P Note (Signed)
History and Physical Interval Note:  01/19/2021 10:20 AM  Kathleen Farmer  has presented today for surgery, with the diagnosis of PH adenomatous polyps.  The various methods of treatment have been discussed with the patient and family. After consideration of risks, benefits and other options for treatment, the patient has consented to  Procedure(s): COLONOSCOPY WITH PROPOFOL (N/A) as a surgical intervention.  The patient's history has been reviewed, patient examined, no change in status, stable for surgery.  I have reviewed the patient's chart and labs.  Questions were answered to the patient's satisfaction.     [name]  Ok to proceed with colonoscopy

## 2021-01-19 NOTE — Transfer of Care (Signed)
Immediate Anesthesia Transfer of Care Note  Patient: Kathleen Farmer  Procedure(s) Performed: COLONOSCOPY WITH PROPOFOL (N/A )  Patient Location: PACU  Anesthesia Type:General  Level of Consciousness: awake, alert  and oriented  Airway & Oxygen Therapy: Patient Spontanous Breathing and Patient connected to nasal cannula oxygen  Post-op Assessment: Report given to RN and Post -op Vital signs reviewed and stable  Post vital signs: Reviewed and stable  Last Vitals:  Vitals Value Taken Time  BP    Temp    Pulse    Resp    SpO2      Last Pain:  Vitals:   01/19/21 1005  PainSc: 0-No pain         Complications: No complications documented.

## 2021-01-19 NOTE — Op Note (Signed)
[facility] Patient Name: Kathleen Farmer Procedure Date: 01/19/2021 10:23 AM MRN: [id number] Account #: [id number] Date of Birth: March 14, 1946 Admit Type: Outpatient Age: 75 Room: [facility] ENDO ROOM 1 Gender: Female Note Status: Finalized Procedure:             Colonoscopy Indications:           High risk colon cancer surveillance: Personal history                         of multiple (3 or more) adenomas, Family history of                         colon cancer in a first-degree relative before age 54                         years Providers:             Andrey Farmer MD, MD Medicines:             Monitored Anesthesia Care Complications:         No immediate complications. Procedure:             Pre-Anesthesia Assessment:                        - Prior to the procedure, a History and Physical was                         performed, and patient medications and allergies were                         reviewed. The patient is competent. The risks and                         benefits of the procedure and the sedation options and                         risks were discussed with the patient. All questions                         were answered and informed consent was obtained.                         Patient identification and proposed procedure were                         verified by the physician, the nurse, the anesthetist                         and the technician in the endoscopy suite. Mental                         Status Examination: alert and oriented. Airway                         Examination: normal oropharyngeal airway and neck                         mobility. Respiratory Examination: clear to  auscultation. CV Examination: normal. Prophylactic                         Antibiotics: The patient does not require prophylactic                         antibiotics. Prior Anticoagulants: The patient has                          taken no previous anticoagulant or antiplatelet                         agents. ASA Grade Assessment: II - A patient with mild                         systemic disease. After reviewing the risks and                         benefits, the patient was deemed in satisfactory                         condition to undergo the procedure. The anesthesia                         plan was to use monitored anesthesia care (MAC).                         Immediately prior to administration of medications,                         the patient was re-assessed for adequacy to receive                         sedatives. The heart rate, respiratory rate, oxygen                         saturations, blood pressure, adequacy of pulmonary                         ventilation, and response to care were monitored                         throughout the procedure. The physical status of the                         patient was re-assessed after the procedure.                        After obtaining informed consent, the colonoscope was                         passed under direct vision. Throughout the procedure,                         the patient's blood pressure, pulse, and oxygen                         saturations were monitored continuously. The  Colonoscope was introduced through the anus and                         advanced to the the cecum, identified by appendiceal                         orifice and ileocecal valve. The colonoscopy was                         performed without difficulty. The patient tolerated                         the procedure well. The quality of the bowel                         preparation was good. Findings:      The perianal and digital rectal examinations were normal.      Multiple small and large-mouthed diverticula were found in the sigmoid       colon, descending colon and transverse colon.      Internal hemorrhoids were found during retroflexion. The  hemorrhoids       were Grade I (internal hemorrhoids that do not prolapse).      The exam was otherwise without abnormality on direct and retroflexion       views. Impression:            - Diverticulosis in the sigmoid colon, in the                         descending colon and in the transverse colon.                        - Internal hemorrhoids.                        - The examination was otherwise normal on direct and                         retroflexion views.                        - No specimens collected. Recommendation:        - Discharge patient to home.                        - Resume previous diet.                        - Continue present medications.                        - Repeat colonoscopy in 5 years for surveillance.                        - Return to referring physician as previously                         scheduled. Procedure Code(s):     --- Professional ---                        B1517, Colorectal  cancer screening; colonoscopy on                         individual at high risk Diagnosis Code(s):     --- Professional ---                        Z86.010, Personal history of colonic polyps                        K64.0, First degree hemorrhoids                        Z80.0, Family history of malignant neoplasm of                         digestive organs                        K57.30, Diverticulosis of large intestine without                         perforation or abscess without bleeding CPT copyright 2019 American Medical Association. All rights reserved. The codes documented in this report are preliminary and upon coder review may  be revised to meet current compliance requirements. Andrey Farmer MD, MD 01/19/2021 10:44:25 AM Number of Addenda: 0 Note Initiated On: 01/19/2021 10:23 AM Scope Withdrawal Time: 0 hours 8 minutes 2 seconds  Total Procedure Duration: 0 hours 12 minutes 1 second  Estimated Blood Loss:  Estimated blood loss: none.      [facility]

## 2021-01-20 ENCOUNTER — Encounter: Payer: Self-pay | Admitting: Gastroenterology

## 2021-01-20 NOTE — Anesthesia Postprocedure Evaluation (Signed)
Anesthesia Post Note  Patient: Kathleen Farmer  Procedure(s) Performed: COLONOSCOPY WITH PROPOFOL (N/A )  Patient location during evaluation: PACU Anesthesia Type: General Level of consciousness: awake and alert Pain management: pain level controlled Vital Signs Assessment: post-procedure vital signs reviewed and stable Respiratory status: spontaneous breathing, nonlabored ventilation and respiratory function stable Cardiovascular status: blood pressure returned to baseline and stable Postop Assessment: no apparent nausea or vomiting Anesthetic complications: no   No complications documented.   Last Vitals:  Vitals:   01/19/21 1110 01/19/21 1120  BP: 123/68   Pulse: 71   Resp: 18 (!) 21  Temp:    SpO2: 100% 100%    Last Pain:  Vitals:   01/20/21 0725  PainSc: 0-No pain                 Brett Canales Leone Mobley

## 2021-08-05 ENCOUNTER — Other Ambulatory Visit: Payer: Self-pay | Admitting: Family Medicine

## 2021-08-05 DIAGNOSIS — Z1231 Encounter for screening mammogram for malignant neoplasm of breast: Secondary | ICD-10-CM

## 2021-09-08 ENCOUNTER — Other Ambulatory Visit: Payer: Self-pay

## 2021-09-08 ENCOUNTER — Ambulatory Visit
Admission: RE | Admit: 2021-09-08 | Discharge: 2021-09-08 | Disposition: A | Discharge status: Home / Self Care | Payer: Medicare HMO | Source: Ambulatory Visit | Attending: Family Medicine | Admitting: Family Medicine

## 2021-09-08 DIAGNOSIS — Z1231 Encounter for screening mammogram for malignant neoplasm of breast: Secondary | ICD-10-CM | POA: Insufficient documentation

## 2021-11-09 NOTE — Telephone Encounter (Signed)
Packet sent

## 2021-11-09 NOTE — Telephone Encounter (Signed)
Pt name and DOB verified.    Who is calling, name of caller? Pt    Who is your current or most recent primary care provider and   which hospital were they affiliated with? DR Marisue Ivan through [facility]    How long did you see them for? 6 years     Is there a reason that you are looking for a new provider? Just moved back to [place] to be closer to family.     Would you like to provide your e-mail to sign up for out My Chart system? N/A  The benefits of being a My Chart member are:    -Request medical appointments  -View your health summary from the My Chart electronic health record.  -View test results.  -Request prescription renewals.  -Access trusted health information resources.  -Communicate electronically and securely with your medical care team.    Do you have a preference about the provider you will see? No    When was your last Annual or Well Child Exam? September     Do you have any ongoing or chronic conditions you are currently being treated   for (e.g. Hypertension, Diabetes, Depression, etc)? If yes list: High BP and cholesterol.    Have you been seen by a provider for these chronic conditions in the last 6 months? If so, who? No    Do you need an appointment to establish care or is there something more   urgent you need to be seen for? Explain: No    Are you on any medications that require a special prescription such as a sleeping pills, pain medication or stimulants? (if no go ahead and book appointment, if yes please read the following: High blood pressure med and cholesterol medication.     "Please be aware that our provider's may not prescribe these medications at the first visit as they need to get to know more about you and your medical problems/history before they will safely prescribe these" Informed? Yes or No Yes    Do you have any questions about what we discussed? No  (If no go ahead and book, if yes please get the details and send to the practice)    Writer  scheduled pt on 08/22/22 at 145 with Marissa Tardif.       **Thank you for choosing [place] for your health care needs.   Ms. Lyndy Russman I do want to let you know that you will need to contact your insurance company and make them aware of your new Primary Care Provider which is No primary care provider on file.. Also Ms. Keturah Shavers a new patient packet will be coming to you. Would you like to have this release of information faxed to you? MAIL If faxed what is the number? N/A If not, you can stop by our practice to complete the form or we can mail you the form. N/A    Notes: Pt had a colonoscopy back in September and found no polyps.  Mammogram also in September and came back as normal.  Eye test done back in September as well.  Exercise regimen, stretching exervises every day; hoola hoops, total gym, and weights.     CB#: [phone] (home)     Crystal Haney

## 2022-08-16 ENCOUNTER — Encounter

## 2022-08-16 NOTE — Progress Notes (Signed)
[facility]  [address]  [place] [address]     Dezirae Service  female  1946/10/14  08/22/2022      Laymond Purser, FNP  Status: Complete document  OV: new patient   No records received  Yes centricity   HIN checked  Pre-Visit Planning Update  Most Recent Vitals  Weight:  Date;    BMI:   Date:  BP:   Date:    Pulse:   Date;    O2:    Advanced directives last updated: none found     Colonoscopy Date:   Result:   Next Due:  Hemoccult Date:   Result:     Mammogram Date:10/12/2015 Result:BI-RADS 2: benign Next Due:10/11/2016  Pap  Date:   Result:   Hysterectomy:  HPV  Date:   Result:     Dexa Scan Date:10/12/2015 Result: Osteopenia      Hep C Screen Date: 07/20/2015 Result:NR       AAA [place] Date:     Social History     Tobacco Use   Smoking Status Not on file   Smokeless Tobacco Not on file       []  Smoker []  Non Smoker    _____ Date Quit  _____# Years Smoker  _____ packs/day    Last OV [facility] Date:  Last AV at [facility]  Date:     Diabetic Foot Exam:  Date;   Diabetic Eye Exam: Date:  HgBA1C  Date: 07/20/2015  5.9%    Microalbumin UA  Date:     Influenza Date;   Zostavax Date:   Shingrix Date:   Tetanus Date:07/12/2011  Pneumo  Date: 07/12/2013    Prevnar Date: 07/15/2014    COVID  Date: _____________________    PHQ-9:  Date;     CAGE-AID: Date:    Fall Screening Date:

## 2022-08-16 NOTE — Telephone Encounter (Signed)
This encounter was created in error - please disregard.

## 2022-08-22 ENCOUNTER — Ambulatory Visit: Admit: 2022-08-22 | Discharge: 2022-08-22 | Payer: MEDICARE | Attending: Family | Primary: Family

## 2022-08-22 DIAGNOSIS — Z7689 Persons encountering health services in other specified circumstances: Secondary | ICD-10-CM

## 2022-08-22 DIAGNOSIS — Z0000 Encounter for general adult medical examination without abnormal findings: Secondary | ICD-10-CM

## 2022-08-22 MED ORDER — HYDROCHLOROTHIAZIDE 25 MG PO TABS
25 MG | ORAL_TABLET | Freq: Every day | ORAL | 3 refills | Status: DC
Start: 2022-08-22 — End: 2023-02-28

## 2022-08-22 NOTE — Assessment & Plan Note (Signed)
Monitored by specialist- no acute findings meriting change in the plan.

## 2022-08-22 NOTE — Assessment & Plan Note (Signed)
Historically intolerable to statin.  Lipids ordered.  Continue with exercise, diet modifications.

## 2022-08-22 NOTE — Assessment & Plan Note (Addendum)
.  Cancer Screening  - Cervical: Hx of partial hysterectomy, has her ovaries.  Pap Smear no longer indicated.  - Colon: 09/22 - per pt report no polyps, advised to not have any further colonoscopies  - Breast: 08/2021 - due now, ordered    Immunizations:  - Fluvax: Due Fall '23 - intends on getting  - Shingrix: UTD  - Tetanus: UTD  - Due age 75/66: Prevnar(PCV13): 07/2014  - Pneumovax(PPSV23): 07/2013  - PCV 20: 08/22/22   - COVID 19: Discussed upcoming vaccine

## 2022-08-22 NOTE — Progress Notes (Signed)
[facility]   [address]  [place] [address]    CHIEF COMPLAINT   Crystal Haney is a 76 y.o. female who presents today for New Patient    HISTORY OF PRESENT ILLNESS   Patient presents for new patient visit/establish care.    She is the wife of this providers patient, Sharlynn Oliphant. 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    Lives with her daughter, Archie Patten.  2 sons who live locally.  Everyone gets together Sunday.  Her friends still come over.    Moved to back [place] from [place].  Previous PCP:  DR Marisue Ivan through [facility] [employer]  Last annual performed in September.    Notes from Epic:  Pt had a colonoscopy back in September and found no polyps.  She was advised no further colonoscopies bc of age.  Mammogram also in September and came back as normal.   Eye test done back in September as well.  Scheduled for this September.  Exercise regimen, stretching exervises every day; hoola hoops, total gym, and weights.     CHOLESTEROL:    She  No current medications. Previously she has had statins with pins and needles and myalgias.  Current medication: This patient does not have an active medication from one of the medication groupers.     Cardiac Risk Factors include:    The ASCVD Risk score (Arnett DK, et al., 2019) failed to calculate for the following reasons:    Cannot find a previous HDL lab    Cannot find a previous total cholesterol lab    No results found for: "LDLCHOLESTEROL", "LDLCALC", "LDLDIRECT", "HDL", "TRIG", "ALT", "LDLEXT", "HDLEXT", "TRIGLYCEXT", "SGPTALTEXT"       HYPERTENSION:  Has been our of HCTZ for quite awhile.     Home blood pressure monitoring is not being performed.  Chest pain:  no  Dizziness:  no  New or worsening leg edema:  no  Doesn't put salt on anything. Likes her chips.    BP Readings from Last 3 Encounters:   08/22/22 (!) 150/80     No results found for: "K", "CREATININE", "NA"   Key Anti-Hypertensive Meds            hydroCHLOROthiazide  (HYDRODIURIL) 25 MG tablet (Taking)    Sig - Route: Take 1 tablet by mouth daily - Oral              Hx hospitalization with hypokalemia, diverticulosis.  Denies hx of CAD, CVA, cancer.  Family hx of colon CA - father, sister.  Father had CVA, sister did was well. Both were heavy smokers.    1 tablespoon of honey, olive oil.  1 tablespoon of apple cider vinegar BID.  Metamucil daily.    No acute concerns.  PHYSICAL EXAM   General: Well appearing older adult female, well dressed/groomed, seated comfortably in exam room chair in NAD  Heart: regular rate and rhythm without murmur  Lungs: clear to auscultation  Abdomen: normoactive bs x4, abdomen soft, nontender, no masses   Extremities: no edema, pulses are normal  Psychological:  Appropriate mood, affect and cognition  MEDICATIONS     Current Outpatient Medications   Medication Sig    hydroCHLOROthiazide (HYDRODIURIL) 25 MG tablet Take 1 tablet by mouth daily     No current facility-administered medications for this visit.     Medications Discontinued During This Encounter   Medication Reason    hydroCHLOROthiazide (HYDRODIURIL) 25 MG  tablet REORDER       ALLERGIES     Allergies   Allergen Reactions    Ampicillin Hives     ACTIVE MEDICAL PROBLEMS     Patient Active Problem List   Diagnosis    Laboratory exam ordered as part of routine general medical examination    Primary hypertension    Raised intraocular pressure of both eyes    Hyperlipidemia    Osteopenia    Nervousness    Caregiver burden     SOCIAL HISTORY     Social History     Social History Narrative    Moved back to [place] last year (2022). Lives with her husband, Sharlynn Oliphant who had end stage COPD. Takes care of him. They live with their daughter.  They have 2 supportive sons as well. Close family.    Exercises, eats healthy.    No tobacco, ETOH, illicit substances.    Laymond Purser, FNP   (08/22/22, 2:29 PM)     VITALS     Vitals:    08/22/22 1340   BP: (!) 150/80   Site: Left Upper Arm   Position: Sitting    Cuff Size: Small Adult   Pulse: 83   Resp: 14   SpO2: 98%   Weight: 135 lb (61.2 kg)   Height: 5\' 1"  (1.549 m)    - Body mass index is 25.51 kg/m.    ASSESSMENT AND PLAN     1. Encounter to establish care  Assessment & Plan:  .Cancer Screening  - Cervical: Hx of partial hysterectomy, has her ovaries.  Pap Smear no longer indicated.  - Colon: 09/22 - per pt report no polyps, advised to not have any further colonoscopies  - Breast: 08/2021 - due now, ordered    Immunizations:  - Fluvax: Due Fall '23 - intends on getting  - Shingrix: UTD  - Tetanus: UTD  - Due age 52/66: Prevnar(PCV13): 07/2014  - Pneumovax(PPSV23): 07/2013  - PCV 20: 08/22/22   - COVID 19: Discussed upcoming vaccine        2. Primary hypertension  Assessment & Plan:  Recommendations: eating a healthy diet, low-sodium intake (<1.5gm/day), and regular aerobic exercise (150 minutes or more per week).  Encouraged home blood pressure monitoring, and follow up if systolic blood pressure is over 140 or diastolic pressure is over 90 consistently.  Plan and medication management: Blood pressure is elevated - restart HCTZ 25 mg daily.   CMP 1 week after restarting HCTZ.  Continue banana daily - hx of hypokalemia.    Orders:  -     Comprehensive Metabolic Panel w/ Reflex to MG; Future  3. Raised intraocular pressure of both eyes  Assessment & Plan:   Monitored by specialist- no acute findings meriting change in the plan.  4. Hyperlipidemia, unspecified hyperlipidemia type  Assessment & Plan:  Historically intolerable to statin.  Lipids ordered.  Continue with exercise, diet modifications.  Orders:  -     Lipid Panel; Future  5. Nervousness  Assessment & Plan:   Borderline controlled, has taken OTC CBD gummie with good effect w/out s/e. No changes made.   6. Osteopenia, unspecified location  Assessment & Plan:  Educated patient on taking vitamin D 800 iu (minimally) with Calcium 1200 mg daily and engaging in weight bearing activity.    Orders:  -     Vitamin D  25 Hydroxy; Future  7. Caregiver burden  Assessment & Plan:   She does very well  with caring for her husband. Ample family support.  Empathetic listening, support provided.  8. Laboratory exam ordered as part of routine general medical examination  Assessment & Plan:  .Cancer Screening  - Cervical: Hx of partial hysterectomy, has her ovaries.  Pap Smear no longer indicated.  - Colon: 09/22 - per pt report no polyps, advised to not have any further colonoscopies  - Breast: 08/2021 - due now, ordered    Immunizations:  - Fluvax: Due Fall '23 - intends on getting  - Shingrix: UTD  - Tetanus: UTD  - Due age 38/66: Prevnar(PCV13): 07/2014  - Pneumovax(PPSV23): 07/2013  - PCV 20: 08/22/22   - COVID 19: Discussed upcoming vaccine        Orders:  -     hydroCHLOROthiazide (HYDRODIURIL) 25 MG tablet; Take 1 tablet by mouth daily, Disp-90 tablet, R-3Normal  -     CBC with Auto Differential; Future  -     Comprehensive Metabolic Panel w/ Reflex to MG; Future  -     Lipid Panel; Future  -     Thyroid Cascade Profile; Future  -     Vitamin D 25 Hydroxy; Future  9. Encounter for immunization  -     Pneumococcal, PCV20, PREVNAR 20, (age 22 yrs+), IM, PF  10. Encounter for screening mammogram for malignant neoplasm of breast  -     MAM TOMO DIGITAL SCREEN BILATERAL; Future      Level of Service based on time:  I personally spent a total of  38  minutes on today's visit doing chart preparation, review of previous records and labs, performing a medically appropriate evaluation, counseling and educating the patient and documenting clinical information in the electronic health record.   Follow up:  Return in about 6 months (around 02/20/2023) for Anemia.     Future Appointments   Date Time Provider Department Center   02/20/2023 10:00 AM Laymond Purser, FNP [facility] S[facility] AMB         Laymond Purser, [place]  08/22/2022

## 2022-08-22 NOTE — Assessment & Plan Note (Signed)
Borderline controlled, has taken OTC CBD gummie with good effect w/out s/e. No changes made.

## 2022-08-22 NOTE — Assessment & Plan Note (Signed)
Educated patient on taking vitamin D 800 iu (minimally) with Calcium 1200 mg daily and engaging in weight bearing activity.

## 2022-08-22 NOTE — Patient Instructions (Signed)
1200 mg of Calcium   Continue Vitamin D.

## 2022-08-22 NOTE — Assessment & Plan Note (Signed)
   Recommendations: eating a healthy diet, low-sodium intake (<1.5gm/day), and regular aerobic exercise (150 minutes or more per week).   Encouraged home blood pressure monitoring, and follow up if systolic blood pressure is over 140 or diastolic pressure is over 90 consistently.   Plan and medication management: Blood pressure is elevated - restart HCTZ 25 mg daily.    CMP 1 week after restarting HCTZ.   Continue banana daily - hx of hypokalemia.

## 2022-08-22 NOTE — Assessment & Plan Note (Signed)
She does very well with caring for her husband. Ample family support.  Empathetic listening, support provided.

## 2022-08-31 ENCOUNTER — Inpatient Hospital Stay: Admit: 2022-08-31 | Payer: MEDICARE | Primary: Family

## 2022-08-31 ENCOUNTER — Telehealth

## 2022-08-31 DIAGNOSIS — Z0000 Encounter for general adult medical examination without abnormal findings: Secondary | ICD-10-CM

## 2022-08-31 LAB — LIPID PANEL
Chol/HDL Ratio: 4.5
Cholesterol: 267 MG/DL — ABNORMAL HIGH (ref 0–199)
HDL: 59 MG/DL (ref >50)
LDL Cholesterol: 173.2 MG/DL
Non-HDL Cholesterol: 208 mg/dL
Triglycerides: 174 MG/DL — ABNORMAL HIGH (ref <150)

## 2022-08-31 LAB — CBC WITH AUTO DIFFERENTIAL
Absolute Eos #: 0.1 10*3/uL (ref 0.0–0.4)
Absolute Immature Granulocyte: 0 10*3/uL (ref 0.00–0.04)
Absolute Mono #: 0.6 10*3/uL (ref 0.2–1.0)
Basophils Absolute: 0.1 10*3/uL (ref 0.0–0.1)
Basophils: 1 % (ref 0–2)
Eosinophils %: 1 % (ref 0–5)
Hematocrit: 44.3 % (ref 37.0–47.0)
Hemoglobin: 15 g/dL (ref 12.0–16.0)
Immature Granulocytes: 0 % (ref 0.0–0.6)
Lymphocytes Absolute: 2.4 10*3/uL (ref 1.2–3.7)
Lymphocytes: 33 % (ref 14–46)
MCH: 28.6 PG (ref 27.0–31.0)
MCHC: 33.9 g/dL (ref 33.0–37.0)
MCV: 84.4 FL (ref 80.0–94.0)
MPV: 9.8 FL (ref 7.4–10.4)
Monocytes: 8 % (ref 5–12)
Nucleated RBCs: 0 PER 100 WBC
Platelets: 324 10*3/uL (ref 130–400)
RBC: 5.25 M/uL (ref 4.20–5.40)
RDW: 13 % (ref 11.5–14.5)
Seg Neutrophils: 57 % (ref 47–80)
Segs Absolute: 4 10*3/uL (ref 1.6–6.1)
WBC: 7.2 10*3/uL (ref 4.5–10.9)
nRBC: 0 10*3/uL

## 2022-08-31 LAB — COMPREHENSIVE METABOLIC PANEL W/ REFLEX TO MG FOR LOW K
ALT: 20 U/L (ref 12–78)
AST: 15 U/L (ref 10–37)
Albumin: 3.9 g/dL (ref 3.4–5.0)
Alk Phosphatase: 78 U/L (ref 43–117)
BUN: 13 MG/DL (ref 7–22)
CO2: 28 mmol/L (ref 21–32)
Calcium: 9.3 MG/DL (ref 8.5–10.1)
Chloride: 100 mmol/L (ref 98–108)
Creatinine: 0.87 MG/DL (ref 0.55–1.10)
Est, Glom Filt Rate: >60 mL/min/{1.73_m2} (ref >60)
Glucose: 113 mg/dL — ABNORMAL HIGH (ref 74–106)
Potassium: 3 mmol/L — ABNORMAL LOW (ref 3.4–5.1)
Sodium: 135 mmol/L — ABNORMAL LOW (ref 136–145)
Total Bilirubin: 0.6 mg/dL (ref 0.00–1.00)
Total Protein: 7.4 g/dL (ref 6.4–8.2)

## 2022-08-31 LAB — THYROID CASCADE PROFILE: TSH, 3RD GENERATION: 1.67 u[IU]/mL (ref 0.358–3.740)

## 2022-08-31 LAB — VITAMIN D 25 HYDROXY: Vit D, 25-Hydroxy: 48.4 ng/mL

## 2022-08-31 LAB — MAGNESIUM: Magnesium: 2 mg/dL (ref 1.6–2.6)

## 2022-08-31 MED ORDER — POTASSIUM CHLORIDE CRYS ER 20 MEQ PO TBCR
20 MEQ | ORAL_TABLET | Freq: Every day | ORAL | 5 refills | Status: DC
Start: 2022-08-31 — End: 2023-02-27

## 2022-08-31 NOTE — Telephone Encounter (Signed)
Pt informed of results and recommendations.  Pt is agreeable to start potassium.  Lab order pending as it was not pending.    Pt declines the atorvastatin, all results and risks reviewed.   08/31/2022 2:01 PM

## 2022-08-31 NOTE — Telephone Encounter (Signed)
Please let patient know that her potassium level is low.  This is because of the hydrochlorothiazide.  20 mEq of potassium chloride one tab daily has been sent in for her to start.  Repeat potassium level in one week.    Her lipid panel shows high cholesterol.  This does increase her risk of heart attack or stroke.  Which she like to consider treatment for her cholesterol?  If so, atorvastatin 20 mg one tab daily would be what I would prescribed.  We would follow-up with repeat lipid panel and hepatic panel in two months if she chooses to move forward with this.    Otherwise, her CBC showed no evidence of anemia or infection.    Electrolytes other than potassium-WNL   Liver and kidney function-WNL   Thyroid-WNL   Vitamin-D-sufficient  Magnesium-WNL    Junita Push, FNP   (08/31/22, 1:05 PM)      [facility] Outpatient Visit on 08/31/2022   Component Date Value Ref Range Status    WBC 08/31/2022 7.2  4.5 - 10.9 K/uL Final    RBC 08/31/2022 5.25  4.20 - 5.40 M/uL Final    Hemoglobin 08/31/2022 15.0  12.0 - 16.0 g/dL Final    Hematocrit 08/31/2022 44.3  37.0 - 47.0 % Final    MCV 08/31/2022 84.4  80.0 - 94.0 FL Final    MCH 08/31/2022 28.6  27.0 - 31.0 PG Final    MCHC 08/31/2022 33.9  33.0 - 37.0 g/dL Final    RDW 08/31/2022 13.0  11.5 - 14.5 % Final    Platelets 08/31/2022 324  130 - 400 K/uL Final    MPV 08/31/2022 9.8  7.4 - 10.4 FL Final    Nucleated RBCs 08/31/2022 0.0  PER 100 WBC Final    nRBC 08/31/2022 0.00  K/uL Final    Seg Neutrophils 08/31/2022 57  47 - 80 % Final    Lymphocytes 08/31/2022 33  14 - 46 % Final    Monocytes 08/31/2022 8  5 - 12 % Final    Eosinophils % 08/31/2022 1  0 - 5 % Final    Basophils 08/31/2022 1  0 - 2 % Final    Immature Granulocytes 08/31/2022 0  0.0 - 0.6 % Final    Segs Absolute 08/31/2022 4.0  1.6 - 6.1 K/UL Final    Absolute Lymph # 08/31/2022 2.4  1.2 - 3.7 K/UL Final    Absolute Mono # 08/31/2022 0.6  0.2 - 1.0 K/UL Final    Absolute Eos # 08/31/2022 0.1   0.0 - 0.4 K/UL Final    Basophils Absolute 08/31/2022 0.1  0.0 - 0.1 K/UL Final    Absolute Immature Granulocyte 08/31/2022 0.0  0.00 - 0.04 K/UL Final    Differential Type 08/31/2022 AUTOMATED   Final    Sodium 08/31/2022 135 (L)  136 - 145 mmol/L Final    Potassium 08/31/2022 3.0 (L)  3.4 - 5.1 mmol/L Final    Chloride 08/31/2022 100  98 - 108 mmol/L Final    CO2 08/31/2022 28  21 - 32 mmol/L Final    Glucose 08/31/2022 113 (H)  74 - 106 mg/dL Final    BUN 08/31/2022 13  7 - 22 MG/DL Final    Creatinine 08/31/2022 0.87  0.55 - 1.10 MG/DL Final    Est, Glom Filt Rate 08/31/2022 >60  >60 ml/min/1.53m Final    Calcium 08/31/2022 9.3  8.5 - 10.1 MG/DL Final  Total Bilirubin 08/31/2022 0.60  0.00 - 1.00 mg/dL Final    ALT 08/31/2022 20  12 - 78 U/L Final    AST 08/31/2022 15  10 - 37 U/L Final    Alk Phosphatase 08/31/2022 78  43 - 117 U/L Final    Total Protein 08/31/2022 7.4  6.4 - 8.2 g/dL Final    Albumin 08/31/2022 3.9  3.4 - 5.0 g/dL Final    Cholesterol 08/31/2022 267 (H)  0 - 199 MG/DL Final    Triglycerides 08/31/2022 174 (H)  <150 MG/DL Final    HDL 08/31/2022 59  >50 MG/DL Final    LDL Cholesterol 08/31/2022 173.2  MG/DL Final    Chol/HDL Ratio 08/31/2022 4.5   Final    Non-HDL Cholesterol 08/31/2022 208  mg/dL Final    TSH, 3RD GENERATION 08/31/2022 1.670  0.358 - 3.740 uIU/mL Final    Vit D, 25-Hydroxy 08/31/2022 48.4  ng/mL Final    Magnesium 08/31/2022 2.0  1.6 - 2.6 mg/dL Final

## 2022-08-31 NOTE — Telephone Encounter (Signed)
Noted, and thank you.  Junita Push, FNP   (08/31/22, 4:28 PM)

## 2022-09-02 NOTE — Telephone Encounter (Signed)
Pt agreeable to try crushing or breaking it into small bits and mixing in applesauce.  Reinforced that dietary alone is not sufficient due to low level.  Pt agreeable to plan.   09/02/2022 1:26 PM

## 2022-09-02 NOTE — Telephone Encounter (Signed)
Pt name and DOB verified.    Pt picked up the RX       Calling about potassium pills   When first took 1 pill it got lodged in throat choked   Tried again choked on 1/2 pill also    It says it can be dissolved she tried that  vomitted that up    has stopped taking and is researching what foods she can eat to increase potassium.    Pt will not be getting labs done due to not taking the pills      Call with any questions    [place]: [phone]

## 2022-09-02 NOTE — Telephone Encounter (Signed)
That's awful.  Please advise to crush and have in yogurt/apple sauce.    Potassium needs replacement, she will not be able to intake enough via diet.    There is also liquid potassium.    Alternatively, we can try changing HCTZ to a different medication. HCTZ is causing her hypokalemia.    Lab Results   Component Value Date    CREATININE 0.87 08/31/2022    BUN 13 08/31/2022    NA 135 (L) 08/31/2022    K 3.0 (L) 08/31/2022    CL 100 08/31/2022    CO2 28 08/31/2022     Junita Push, FNP   (09/02/22, 1:09 PM)

## 2022-09-09 ENCOUNTER — Ambulatory Visit: Payer: MEDICARE | Primary: Family

## 2022-09-09 ENCOUNTER — Inpatient Hospital Stay: Admit: 2022-09-09 | Payer: MEDICARE | Primary: Family

## 2022-09-09 DIAGNOSIS — Z1231 Encounter for screening mammogram for malignant neoplasm of breast: Secondary | ICD-10-CM

## 2022-09-09 DIAGNOSIS — E876 Hypokalemia: Secondary | ICD-10-CM

## 2022-09-09 LAB — POTASSIUM: Potassium: 3.6 mmol/L (ref 3.4–5.1)

## 2023-02-20 ENCOUNTER — Ambulatory Visit: Payer: MEDICARE | Attending: Family | Primary: Family

## 2023-02-21 ENCOUNTER — Telehealth

## 2023-02-21 NOTE — Telephone Encounter (Signed)
Pt name and DOB verified.  Pt states she was put on a Potassium Supplement after her last Labs and asking if PCP wants to order new Labs prior to her appt?  Upcoming appointments:  Future Appointments   Date Time Provider [place]   02/28/2023  1:45 PM Junita Push, FNP [facility] AMB     [phone] (home)

## 2023-02-22 NOTE — Telephone Encounter (Signed)
My chart message sent to patient with options and PCP recommendations. Labs will be reviewed at next visit.

## 2023-02-22 NOTE — Telephone Encounter (Signed)
We did a follow up potassium after initiation of K supplement. Do not feel it neccessary, however, if she would feel more comfortable making sure it is still at goal, I will enter a renal panel so we can look at renal fx as well.  Junita Push, FNP   (02/22/23, 9:48 AM)

## 2023-02-24 ENCOUNTER — Encounter

## 2023-02-27 MED ORDER — KLOR-CON M20 20 MEQ PO TBCR
20 MEQ | ORAL_TABLET | Freq: Every day | ORAL | 1 refills | Status: AC
Start: 2023-02-27 — End: 2023-02-28

## 2023-02-27 NOTE — Telephone Encounter (Signed)
Name from pharmacy: [facility]          Will file in chart as: KLOR-CON M20 20 MEQ extended release tablet    Sig: TAKE 1 TABLET BY MOUTH EVERY DAY    Disp:  90 tablet    Refills:  1    Start: 02/24/2023    Class: Normal    For: Hypokalemia    Last ordered: 6 months ago (08/31/2022) by Junita Push, FNP    Last refill: 11/26/2022    Rx #: [id number]    Potassium Supplement Protocol Passed 02/24/2023 12:50 AM    Last potassium level normal, within the past 12 months    Visit with authorizing provider in past 9 months or upcoming 90 days      To be filled at: [id number] [address], [place] - [phone]

## 2023-02-28 ENCOUNTER — Ambulatory Visit: Admit: 2023-02-28 | Discharge: 2023-02-28 | Payer: MEDICARE | Attending: Family | Primary: Family

## 2023-02-28 DIAGNOSIS — I10 Essential (primary) hypertension: Secondary | ICD-10-CM

## 2023-02-28 DIAGNOSIS — L989 Disorder of the skin and subcutaneous tissue, unspecified: Secondary | ICD-10-CM

## 2023-02-28 MED ORDER — LOSARTAN POTASSIUM 50 MG PO TABS
50 | ORAL_TABLET | Freq: Every day | ORAL | 1 refills | Status: AC
Start: 2023-02-28 — End: ?

## 2023-02-28 NOTE — Assessment & Plan Note (Signed)
Sadly, loss husband of 23 years last month to end-stage COPD.  React leading to life after loss.  Doing quite well in this regard.    Empathetic listening and support provided.

## 2023-02-28 NOTE — Assessment & Plan Note (Signed)
Multiple month history of nonhealing skin lesion over nose.    Low suspicion for bacterial, fungal infection.  No evidence of HC.    Recommended topical Vaseline-referral to Dermatology.      Multiple year history of nonhealing lesion over left lateral lower extremity.    Low suspicion for bacterial, fungal infection.  Recommended topical hydrocortisone - referral to Dermatology.      Seborrheic keratotic lesion on right thorax.  This is bothersome-causes pain irritation.  Referral to Dermatology.

## 2023-02-28 NOTE — Progress Notes (Signed)
[place] [address]    CHIEF COMPLAINT   Crystal Haney is a 77 y.o. female who presents today for 6 Month Follow-Up    HISTORY OF PRESENT ILLNESS   PMH hypertension, hypokalemia, nervousness, osteopenia, HLD, increased intra-ocular pressure bilaterally.    Sadly, lost her husband in the interval end-stage COPD.  Celebration of Life - [place] a golf [place] May 5th.    Back to exercising.    2 questions - sore on her nose, leg.    Nose:  Bothering here - not painful, no itchy.  Months  Course is worsened  Had a little red spot on her nose and applied "red spot" never went away.  Day cream.  No other topicals.  No hx of skin cancer.    Non healing Right leg lesion:  Been there forever.  Course is unchanged.  Scabs, she picks off and it returns.  Ros negative for pain or itching.   Nothing topically.    HYPERTENSION:    She reports good medication compliance and no side effects.   Lost husband last month.  Sister is sick not making red blood cells. Has had 3 infusions.  Home blood pressure monitoring is not being performed.  Chest pain:  no  Dizziness:  no  New or worsening leg edema:  no    BP Readings from Last 3 Encounters:   02/28/23 (!) 152/80   08/22/22 (!) 150/80     Lab Results   Component Value Date    K 3.6 09/09/2022    CREATININE 0.87 08/31/2022    NA 135 (L) 08/31/2022      Hypertension Medications       Angiotensin II Receptor Antagonists       losartan (COZAAR) 50 MG tablet Take 1 tablet by mouth daily                LABS & IMAGING     No visits with results within 1 Month(s) from this visit.   Latest known visit with results is:   Hospital Outpatient Visit on 09/09/2022   Component Date Value Ref Range Status    Potassium 09/09/2022 3.6  3.4 - 5.1 mmol/L Final     PHYSICAL EXAM     Physical Exam  Constitutional:       General: She is not in acute distress.     Appearance: Normal appearance. She is not ill-appearing.   HENT:      Head:        Comments:  Scab over tip of nose with surrounding erythema.  Nttp - no warmth, induration appreciated.    Skin:     General: Skin is warm and dry.      Findings: Lesion present.             Comments: RLE - 6 mm round blanchable erythematous flat lesion.  Nttp    R thorax - SK   Neurological:      Mental Status: She is alert.      Gait: Gait normal.   Psychiatric:         Mood and Affect: Mood normal.         Behavior: Behavior normal.         Thought Content: Thought content normal.         Judgment: Judgment normal.           MEDICATIONS     Current  Outpatient Medications   Medication Sig    losartan (COZAAR) 50 MG tablet Take 1 tablet by mouth daily     No current facility-administered medications for this visit.     Medications Discontinued During This Encounter   Medication Reason    hydroCHLOROthiazide (HYDRODIURIL) 25 MG tablet Alternate therapy    KLOR-CON M20 20 MEQ extended release tablet Alternate therapy       ALLERGIES     Allergies   Allergen Reactions    Ampicillin Hives     ACTIVE MEDICAL PROBLEMS     Patient Active Problem List   Diagnosis    Primary hypertension    Raised intraocular pressure of both eyes    Hyperlipidemia    Osteopenia    Nervousness    Grief    Skin lesions     SOCIAL HISTORY     Social History     Social History Narrative    Moved back to [place] last year (2022). Lives with her husband, Keane Scrape who had end stage COPD. Takes care of him. They live with their daughter.  They have 2 supportive sons as well. Close family.    Exercises, eats healthy.    No tobacco, ETOH, illicit substances.    Junita Push, FNP   (08/22/22, 2:29 PM)     VITALS     Vitals:    02/28/23 1337   BP: (!) 152/80   Site: Left Upper Arm   Position: Sitting   Cuff Size: Medium Adult   Pulse: 84   Resp: 18   SpO2: 98%   Weight: 60.8 kg (134 lb)   Height: 1.549 m (5\' 1" )    - Body mass index is 25.32 kg/m.    ASSESSMENT AND PLAN     1. Primary hypertension  Assessment & Plan:  Recommendations: eating a healthy diet,  low-sodium intake (<1.5gm/day), and regular aerobic exercise (150 minutes or more per week).  Encouraged home blood pressure monitoring, and follow up if systolic blood pressure is over [id number] or diastolic pressure is over 90 consistently.  Plan and medication management: Blood pressure is elevated - discontinue HCTZ and KlorCon and start losartan 50 mg.  BP check two weeks-RN.  If at goal-renal panel.  If not, up titrate to 100 mg renal panel one week thereafter.    Orders:  -     losartan (COZAAR) 50 MG tablet; Take 1 tablet by mouth daily, Disp-90 tablet, R-1Normal  2. Skin lesions  Assessment & Plan:  Multiple month history of nonhealing skin lesion over nose.    Low suspicion for bacterial, fungal infection.  No evidence of HC.    Recommended topical Vaseline-referral to Dermatology.      Multiple year history of nonhealing lesion over left lateral lower extremity.    Low suspicion for bacterial, fungal infection.  Recommended topical hydrocortisone - referral to Dermatology.      Seborrheic keratotic lesion on right thorax.  This is bothersome-causes pain irritation.  Referral to Dermatology.  Orders:  -     Referral to [facility]  3. Grief  Assessment & Plan:  Sadly, loss husband of 74 years last month to end-stage COPD.  React leading to life after loss.  Doing quite well in this regard.    Empathetic listening and support provided.  Dictation Software Disclaimer:     Parts of this medical record were completed using MModal voice recognition technology. Sometimes similar sounding words are converted into text and remain in  the chart despite proofreading. Please read the entire chart for context.    Follow up:  Return in about 2 weeks (around 03/14/2023) for RN BP check - day I am in office please; 3 month FU.     Future Appointments   Date Time Provider [place]   03/14/2023 10:00 AM [facility] NURSE [facility] AM[facility]   05/30/2023  9:30 AM Junita Push, [place],  FNP  02/28/2023

## 2023-02-28 NOTE — Assessment & Plan Note (Signed)
Recommendations: eating a healthy diet, low-sodium intake (<1.5gm/day), and regular aerobic exercise (150 minutes or more per week).  Encouraged home blood pressure monitoring, and follow up if systolic blood pressure is over [id number] or diastolic pressure is over 90 consistently.  Plan and medication management: Blood pressure is elevated - discontinue HCTZ and KlorCon and start losartan 50 mg.  BP check two weeks-RN.  If at goal-renal panel.  If not, up titrate to 100 mg renal panel one week thereafter.

## 2023-03-11 ENCOUNTER — Inpatient Hospital Stay: Admit: 2023-03-11 | Payer: MEDICARE | Primary: Family

## 2023-03-11 DIAGNOSIS — I10 Essential (primary) hypertension: Secondary | ICD-10-CM

## 2023-03-11 LAB — RENAL FUNCTION PANEL
Albumin: 3.7 g/dL (ref 3.4–5.0)
BUN: 15 MG/DL (ref 7–22)
CO2: 27 mmol/L (ref 21–32)
Calcium: 9.4 MG/DL (ref 8.5–10.1)
Chloride: 107 mmol/L (ref 98–108)
Creatinine: 0.94 MG/DL (ref 0.55–1.10)
Est, Glom Filt Rate: 63 mL/min/{1.73_m2} (ref >60)
Glucose: 123 mg/dL — ABNORMAL HIGH (ref 74–106)
Phosphorus: 2.5 MG/DL (ref 2.5–4.9)
Potassium: 3.7 mmol/L (ref 3.4–5.1)
Sodium: 140 mmol/L (ref 136–145)

## 2023-03-14 ENCOUNTER — Ambulatory Visit: Admit: 2023-03-14 | Discharge: 2023-03-14 | Payer: MEDICARE | Primary: Family

## 2023-03-14 DIAGNOSIS — I10 Essential (primary) hypertension: Secondary | ICD-10-CM

## 2023-03-14 NOTE — Progress Notes (Signed)
Patient presented to office for BP check. 132/62, left arm, sitting.  Olene Godfrey,CMA4/2/20249:46 AM

## 2023-05-30 ENCOUNTER — Ambulatory Visit: Admit: 2023-05-30 | Discharge: 2023-05-30 | Payer: MEDICARE | Attending: Family | Primary: Family

## 2023-05-30 DIAGNOSIS — C4492 Squamous cell carcinoma of skin, unspecified: Secondary | ICD-10-CM

## 2023-05-30 NOTE — Assessment & Plan Note (Signed)
Removed in the interval by [facility].  Advised patient that she should have annual skin evaluation she will call in schedule this.

## 2023-05-30 NOTE — Progress Notes (Signed)
[facility]   [address]  [place] [address]    CHIEF COMPLAINT   Crystal Haney is a 77 y.o. female who presents today for Follow-up (3 mos fu HTN)    HISTORY OF PRESENT ILLNESS   PMH hypertension, hypokalemia, nervousness, osteopenia, HLD, increased intra-ocular pressure bilaterally.     Lost her sister suddenly in the interval.    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    Shares she had severe anxiety after Mohs procedure bc she was fearful of getting skin cancer again.    HYPERTENSION:      Patient presented to the office on the 2nd of April for blood pressure check at which time her blood pressure was at goal: 132/62    She reports good medication compliance and no side effects.   Home blood pressure monitoring is not being performed.  Chest pain:  no  Dizziness:  no  New or worsening leg edema:  no    BP Readings from Last 3 Encounters:   05/30/23 126/66   03/14/23 132/62   02/28/23 (!) 152/80     Lab Results   Component Value Date    K 3.7 03/11/2023    CREATININE 0.94 03/11/2023    NA 140 03/11/2023      Hypertension Medications       Angiotensin II Receptor Antagonists       losartan (COZAAR) 50 MG tablet Take 1 tablet by mouth daily                PHYSICAL EXAM   Physical Exam  Constitutional:       General: She is not in acute distress.     Appearance: Normal appearance.   Cardiovascular:      Rate and Rhythm: Normal rate and regular rhythm.      Pulses: Normal pulses.      Heart sounds: Normal heart sounds. No murmur heard.  Pulmonary:      Effort: Pulmonary effort is normal. No respiratory distress.      Breath sounds: Normal breath sounds. No stridor. No wheezing or rhonchi.   Skin:            Comments: Healing lesion over tip of nose.     Neurological:      Mental Status: She is alert.   Psychiatric:         Mood and Affect: Mood normal.         Behavior:  Behavior normal.         Thought Content: Thought content normal.         Judgment: Judgment normal.       MEDICATIONS     Current Outpatient Medications   Medication Sig    losartan (COZAAR) 50 MG tablet Take 1 tablet by mouth daily     No current facility-administered medications for this visit.     There are no discontinued medications.    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 Lives with her husband, Sharlynn Oliphant who had end stage COPD. Takes care of him. They live with their daughter.  They have 2 supportive sons as well. Close family.    Exercises, eats healthy.    No tobacco, ETOH, illicit substances.    Laymond Purser, FNP   (08/22/22, 2:29 PM)     VITALS     Vitals:    05/30/23 0920   BP: 126/66   Site: Left Upper Arm   Position: Sitting   Cuff Size: Medium Adult   Pulse: 92   Resp: 16   SpO2: 98%   Weight: 61.7 kg (136 lb)    - Body mass index is 25.7 kg/m.    ASSESSMENT AND PLAN     1. SCC (squamous cell carcinoma)  Assessment & Plan:   Removed in the interval by [facility].  Advised patient that she should have annual skin evaluation she will call in schedule this.  2. Basal cell carcinoma (BCC) of skin of nose  Assessment & Plan:   Removed in the interval by [facility].  Advised patient that she should have annual skin evaluation she will call in schedule this.  3. Primary hypertension  Assessment & Plan:  Recommendations: eating a healthy diet, low-sodium intake (<1.5gm/day), and regular aerobic exercise (150 minutes or more per week).  Encouraged home blood pressure  monitoring, and follow up if systolic blood pressure is over 140 or diastolic pressure is over 90 consistently.  Plan and medication management: Blood pressure is normotensive - continue current treatment and no change needed.  Renal panel normal.        Level of Service based on time:  I personally spent a total of 20 minutes on today's visit doing chart preparation, review of previous records and labs, performing a medically appropriate evaluation, counseling and educating the patient and documenting clinical information in the electronic health record. This time is in addition to any time spent on tobacco counseling.        Follow up:  Return in about 4 months (around 09/29/2023) for Annual.     No future appointments.      Laymond Purser, FNP  05/30/2023

## 2023-05-30 NOTE — Assessment & Plan Note (Signed)
Removed in the interval by [facility].  Advised patient that she should have annual skin evaluation she will call in schedule this.

## 2023-05-30 NOTE — Assessment & Plan Note (Signed)
Recommendations: eating a healthy diet, low-sodium intake (<1.5gm/day), and regular aerobic exercise (150 minutes or more per week).  Encouraged home blood pressure monitoring, and follow up if systolic blood pressure is over 140 or diastolic pressure is over 90 consistently.  Plan and medication management: Blood pressure is normotensive - continue current treatment and no change needed.  Renal panel normal.

## 2023-07-14 ENCOUNTER — Telehealth

## 2023-07-14 NOTE — Telephone Encounter (Signed)
Pt calling requesting an imaging order for a mammogram. She received a letter stating that she is due for a routine mammo     Pt CB number [phone]

## 2023-07-19 NOTE — Telephone Encounter (Signed)
Sending to covering provider to order labs

## 2023-07-24 NOTE — Telephone Encounter (Signed)
Pt name and DOB verified.      Pt is calling back to check on status of the mammogram. Pt has reached out to the mammogram dept and they stated they do not have a referral for pt. Pt states she is waiting for word back on that and also she has going to be scheduled for 4 month follow up and that they needed the new providers schedule and pt would like to be scheduled as well. Please assist.

## 2023-07-25 NOTE — Telephone Encounter (Signed)
Sent order for mammogram and please schedule next annual

## 2023-07-25 NOTE — Telephone Encounter (Signed)
Please sign Mammo request and send to front office to schedule visit for annual if possible. Sending to provider for further recommendations.

## 2023-07-26 NOTE — Telephone Encounter (Signed)
Pt name and DOB verified.  Scheduling calling in. They state the order placed for mammo is incorrect. New order needed. New order qued up. Please review and sign. Thanks!

## 2023-07-26 NOTE — Addendum Note (Signed)
Addended by: Lafayette Dragon on: 07/26/2023 12:13 PM     Modules accepted: Orders

## 2023-07-26 NOTE — Addendum Note (Signed)
Addended by: Bosie Clos. on: 07/26/2023 12:06 PM     Modules accepted: Orders

## 2023-08-18 ENCOUNTER — Encounter

## 2023-08-21 MED ORDER — LOSARTAN POTASSIUM 50 MG PO TABS
50 | ORAL_TABLET | Freq: Every day | ORAL | 1 refills | Status: DC
Start: 2023-08-21 — End: 2024-02-15

## 2023-08-21 NOTE — Telephone Encounter (Signed)
 Medication: losartan   Last Office Visit: 1 year  Lab Monitoring: BMP or CMP within 1 year  Category: ARBs  Length of Refill: 1 year  Brand Name: No  Comments: If last BP > S140 or D90 then REFILL ONLY 1 and send a note to provider   BP Readings from Last 3 Encounters:   05/30/23 126/66   03/14/23 132/62   02/28/23 (!) 152/80     Lab Results   Component Value Date/Time    NA 140 03/11/2023 08:10 AM    K 3.7 03/11/2023 08:10 AM    CL 107 03/11/2023 08:10 AM    CO2 27 03/11/2023 08:10 AM    BUN 15 03/11/2023 08:10 AM    CREATININE 0.94 03/11/2023 08:10 AM    GLUCOSE 123 03/11/2023 08:10 AM    CALCIUM 9.4 03/11/2023 08:10 AM    LABGLOM 63 03/11/2023 08:10 AM     Protocol:      Medications Requested:  Requested Prescriptions     Pending Prescriptions Disp Refills    losartan  (COZAAR ) 50 MG tablet [Pharmacy Med Name: LOSARTAN  POTASSIUM 50 MG TAB] 90 tablet 1     Sig: TAKE 1 TABLET BY MOUTH EVERY DAY       Preferred Pharmacy:   [id number] [place] [place] - [address] - P [phone] [place] [phone]  [address]  [place] [address]  Phone: [phone] Fax: [phone]      Date of Last Refill: 02/28/23    Prescription Refill Protocol reviewed: yes    Allergy List Reviewed and Verified: yes    Possible medication to medication interactions reviewed: .yes    Last appt pertaining to medication: 05/30/2023    Upcoming appointment: 09/12/23    Last BP on   BP Readings from Last 3 Encounters:   05/30/23 126/66   03/14/23 132/62   02/28/23 (!) 152/80     Karuna Balducci,CMA9/9/20248:58 AM        MOST RECENT REQUIRED LABS:

## 2023-09-12 ENCOUNTER — Inpatient Hospital Stay: Admit: 2023-09-12 | Payer: MEDICARE | Attending: Family | Primary: Family

## 2023-09-12 DIAGNOSIS — Z1231 Encounter for screening mammogram for malignant neoplasm of breast: Secondary | ICD-10-CM

## 2023-10-04 ENCOUNTER — Ambulatory Visit: Admit: 2023-10-04 | Discharge: 2023-10-04 | Payer: MEDICARE | Attending: Family | Primary: Family

## 2023-10-04 DIAGNOSIS — Z0000 Encounter for general adult medical examination without abnormal findings: Secondary | ICD-10-CM

## 2023-10-04 NOTE — Progress Notes (Signed)
[facility]   [address]  [place] [address]    CHIEF COMPLAINT   Crystal Haney is a 77 y.o. female who presents to the office today for Follow-up (General health) and Hypertension     HISTORY OF PRESENT ILLNESS     CHOLESTEROL:    Current medication: This patient does not have an active medication from one of the medication groupers.   She is not taking any lipid lowering medication at this time.  Cardiac Risk Factors include:  Age (female 31 or over or female 44 or over)  Hypertension  The 10-year ASCVD risk score (Arnett DK, et al., 2019) is: 24%    Values used to calculate the score:      Age: 59 years      Sex: Female      Is Non-Hispanic African American: No      Diabetic: No      Tobacco smoker: No      Systolic Blood Pressure: 136 mmHg      Is BP treated: Yes      HDL Cholesterol: 59 MG/DL      Total Cholesterol: 267 MG/DL   Lab Results   Component Value Date    LDL 173.2 08/31/2022    HDL 59 08/31/2022    TRIG 409 (H) 08/31/2022    ALT 20 08/31/2022                 HYPERTENSION:    She reports good medication compliance and no side effects.   Home blood pressure monitoring is not being performed.  Chest pain:  no  Dizziness:  no  New or worsening leg edema:  no    BP Readings from Last 3 Encounters:   10/04/23 136/72   05/30/23 126/66   03/14/23 132/62     Lab Results   Component Value Date    K 3.7 03/11/2023    CREATININE 0.94 03/11/2023    NA 140 03/11/2023      Hypertension Medications       Angiotensin II Receptor Antagonists       losartan (COZAAR) 50 MG tablet TAKE 1 TABLET BY MOUTH EVERY DAY               MEDICATIONS     Current Outpatient Medications   Medication Sig    losartan (COZAAR) 50 MG tablet TAKE 1 TABLET BY MOUTH EVERY DAY     No current facility-administered medications for this visit.     There are no discontinued medications.    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 Lives with her husband, Sharlynn Oliphant who had end stage COPD. Takes care of him. They live with their daughter.  They have 2 supportive sons as well. Close family.    Exercises, eats healthy.    No tobacco, ETOH, illicit substances.    Laymond Purser, FNP   (08/22/22, 2:29 PM)     FAMILY HISTORY     Family History   Problem Relation Age of Onset    Cancer Father     Stroke Sister     Heart Attack Brother      VITALS     Vitals:    10/04/23 1437   BP: 136/72   Site: Left Upper Arm   Position: Sitting   Cuff Size: Medium Adult   Pulse: 86   Resp: 16   SpO2: 97%   Weight: 62.1 kg (137 lb)   Height: 1.549 m (5\' 1" )    - Body mass index is 25.89 kg/m.    PHYSICAL EXAM     General:  Pleasant, well-dressed and groomed, good historian  Eyes: PERRLA, non-injected  Ears: tympanic membranes are non-erythematous and in the neutral position, EAM without erythema or discharge  Nose: patent  Oropharynx: oral mucosa moist without lesions, no pharyngeal injection, tonsillar hypertrophy or exudate  Neck: no adenopathy, thyromegaly, masses, or bruits  Heart: regular rate and rhythm without murmur  Lungs: clear to auscultation  Abdomen: soft, nontender, no masses or organomegaly  Extremities: no edema, pulses are normal  Musculoskeletal:  No joint swelling or tenderness. Normal gait.  Neurologic:  CNII-XII intact. Normal strength, sensation and reflexes throughout.  Skin:  No  rash or suspicious skin lesions    SCREENING  QUESTIONNAIRES     STOP-BANG (3+ high risk)        No data to display                 DEPRESSION SCREENING  Severity:  1-4 low risk; 5-9 mild depressive symptoms; 10-14 mild major depression; 15-19 moderate major depression; 20-27 severe major depression      02/28/2023     1:43 PM 08/22/2022     1:47 PM   PHQ-9    Little interest or pleasure in doing things 0 0   Feeling down, depressed, or hopeless 0 0   Trouble falling or staying asleep, or sleeping too much  0   Feeling tired or having little energy  0   Poor appetite or overeating  0   Feeling bad about yourself - or that you are a failure or have let yourself or your family down  0   Trouble concentrating on things, such as reading the newspaper or watching television  0   Moving or speaking so slowly that other people could have noticed. Or the opposite - being so fidgety or restless that you have been moving around a lot more than usual  0   Thoughts that you would be better off dead, or of hurting yourself in some way  0   PHQ-2 Score 0 0   PHQ-9 Total Score 0 0   If you checked off any problems, how difficult have these problems made it for you to do your work, take care of things at home, or get along with other people?  0        CAGE-AID (if needed)       No data to display  PREVENTIVE CARE     IMMUNIZATIONS  Immunization History   Administered Date(s) Administered    COVID-19, MODERNA Bivalent, (age 12y+), IM, 50 mcg/0.5 mL 09/22/2023    COVID-19, PFIZER Bivalent, DO NOT Dilute, (age 12y+), IM, 30 mcg/0.3 mL 03/12/2021, 02/15/2022    COVID-19, PFIZER PURPLE top, DILUTE for use, (age 42 y+), 44mcg/0.3mL 02/03/2020, 02/24/2020, 09/21/2020    Influenza, FLUAD, (age 38 y+), IM, Quadv, 0.110mL 09/15/2023    Pneumococcal, PCV-13, PREVNAR 13, (age 6w+), IM, 0.72mL 07/15/2014    Pneumococcal, PCV20, PREVNAR 20, (age 6w+), IM, 0.39mL 08/22/2022    Pneumococcal, PPSV23, PNEUMOVAX 23, (age 2y+), SC/IM, 0.107mL  07/12/2013    TD 5LF, TENIVAC, (age 7y+), IM, 0.70mL 07/12/2011       Health Maintenance Due   Topic Date Due    Hepatitis C screen  Never done    Colorectal Cancer Screen  Never done    Shingles vaccine (1 of 2) Never done    DEXA (modify frequency per FRAX score)  Never done    Respiratory Syncytial Virus (RSV) Pregnant or age 73 yrs+ (1 - 1-dose 60+ series) Never done    DTaP/Tdap/Td vaccine (1 - Tdap) 07/13/2011    Annual Wellness Visit (Medicare Advantage)  Never done       ASSESSMENT AND PLAN      1. Preventative health care  -     Hemoglobin A1C; Future  -     Comprehensive Metabolic Panel w/ Reflex to MG; Future  -     Lipid Panel W/ Reflex Direct LDL; Future  -     TSH; Future  -     CBC with Auto Differential; Future  2. Post-menopausal  Assessment & Plan:   ordered dexa scan  Orders:  -     DEXA Bone Density Axial Skeleton; Future  3. Primary hypertension  Assessment & Plan:   Plan and medication management: Blood pressure is normotensive - continue current treatment and no change needed.  4. Hyperlipidemia, unspecified hyperlipidemia type  Assessment & Plan:   Historically intolerable to statin.  Lipids ordered.  Continue with exercise, diet modifications.       PREVENTIVE CARE EDUCATION & COUNSELING:  Healthy diet, maintaining a healthy weight, and getting at least 30 minutes of exercise most every day.    Immunizations:   Pneumococcal Vaccine: Once for high risk patients - (Smokers, Alcoholics, Chronic Lung, Heart, or Liver Dz), repeat at 65  Influenza Vaccine: Discussed annual vaccination.    HPV / Gardasil 9 (Given at 0,2,6 months, 38-45 y.o.)  COVID-19 Vaccine status reviewed and discussed  Tdap / Td Booster (every 10 yrs)  Cervical Cancer Screening: (every 3-5 yrs from 21-65 y.o.):    HCV Screen:  (once for 18-79 y.o., annual for high risk)  HIV Screen:  (once for 15-65 y.o., annual for high risk)  Reviewed safe amounts of alcohol use (no more than 1 drink per day).    All healthcare maintenance  items due as above.      Follow up:  Return for AWV- next available.     Future Appointments   Date Time Provider Department Center   12/18/2023  2:00 PM [place], [place], [facility]       Lafayette Dragon, FNP  10/04/2023

## 2023-10-05 NOTE — Assessment & Plan Note (Signed)
ordered dexa scan

## 2023-10-05 NOTE — Assessment & Plan Note (Signed)
Plan and medication management: Blood pressure is normotensive - continue current treatment and no change needed.

## 2023-10-05 NOTE — Assessment & Plan Note (Signed)
Historically intolerable to statin.  Lipids ordered.  Continue with exercise, diet modifications.

## 2023-11-06 ENCOUNTER — Ambulatory Visit
Admit: 2023-11-06 | Discharge: 2023-11-07 | Disposition: A | Discharge status: Home / Self Care | Payer: MEDICARE | Source: Ambulatory Visit | Attending: Family | Admitting: Family | Primary: Family

## 2023-11-06 DIAGNOSIS — Z780 Asymptomatic menopausal state: Principal | ICD-10-CM

## 2023-12-11 ENCOUNTER — Other Ambulatory Visit
Admit: 2023-12-11 | Discharge: 2023-12-12 | Disposition: A | Discharge status: Home / Self Care | Payer: MEDICARE | Source: Ambulatory Visit | Attending: Family | Admitting: Family | Primary: Family

## 2023-12-11 DIAGNOSIS — Z0000 Encounter for general adult medical examination without abnormal findings: Principal | ICD-10-CM

## 2023-12-11 LAB — CBC WITH AUTO DIFFERENTIAL
Basophils %: 1 % (ref 0–2)
Basophils Absolute: 0 10*3/uL (ref 0.0–0.1)
Eosinophils %: 2 % (ref 0–5)
Eosinophils Absolute: 0.1 10*3/uL (ref 0.0–0.4)
Hematocrit: 45.9 % (ref 37.0–47.0)
Hemoglobin: 14.8 g/dL (ref 12.0–16.0)
Immature Granulocytes %: 0 % (ref 0.0–0.6)
Immature Granulocytes Absolute: 0 10*3/uL (ref 0.00–0.04)
Lymphocytes Absolute: 2.5 10*3/uL (ref 1.2–3.7)
Lymphocytes: 34 % (ref 14–46)
MCH: 28.8 pg (ref 27.0–31.0)
MCHC: 32.2 g/dL — ABNORMAL LOW (ref 33.0–37.0)
MCV: 89.3 fL (ref 80.0–94.0)
MPV: 9.6 fL (ref 7.4–10.4)
Monocytes %: 8 % (ref 5–12)
Monocytes Absolute: 0.6 10*3/uL (ref 0.2–1.0)
Neutrophils Absolute: 4.1 10*3/uL (ref 1.6–6.1)
Nucleated RBCs: 0 /100{WBCs}
Platelets: 308 10*3/uL (ref 130–400)
RBC: 5.14 M/uL (ref 4.20–5.40)
RDW: 13 % (ref 11.5–14.5)
Seg Neutrophils: 55 % (ref 47–80)
WBC: 7.3 10*3/uL (ref 4.5–10.9)
nRBC: 0 10*3/uL

## 2023-12-11 LAB — HEMOGLOBIN A1C
Estimated Avg Glucose: 126 mg/dL
Hemoglobin A1C: 6 % — ABNORMAL HIGH (ref <5.7)

## 2023-12-11 LAB — COMPREHENSIVE METABOLIC PANEL W/ REFLEX TO MG FOR LOW K
ALT: 20 U/L (ref 12–78)
AST: 15 U/L (ref 10–37)
Albumin: 3.6 g/dL (ref 3.4–5.0)
Alk Phosphatase: 84 U/L (ref 43–117)
BUN: 18 mg/dL (ref 7–22)
CO2: 25 mmol/L (ref 21–32)
Calcium: 9.3 mg/dL (ref 8.5–10.1)
Chloride: 107 mmol/L (ref 98–108)
Creatinine: 0.95 mg/dL (ref 0.55–1.10)
Est, Glom Filt Rate: 62 mL/min/{1.73_m2} (ref >60)
Glucose: 127 mg/dL — ABNORMAL HIGH (ref 74–106)
Potassium: 4 mmol/L (ref 3.4–5.1)
Sodium: 138 mmol/L (ref 136–145)
Total Bilirubin: 0.5 mg/dL (ref 0.00–1.00)
Total Protein: 7.6 g/dL (ref 6.4–8.2)

## 2023-12-11 LAB — LIPID PANEL
Chol/HDL Ratio: 4.8
Cholesterol, Total: 325 mg/dL — ABNORMAL HIGH (ref 0–199)
HDL: 68 mg/dL (ref >50)
LDL Cholesterol: 199.8 mg/dL
Non-HDL Cholesterol: 257 mg/dL
Triglycerides: 286 mg/dL — ABNORMAL HIGH (ref <150)

## 2023-12-11 LAB — TSH: TSH: 2.18 u[IU]/mL (ref 0.358–3.740)

## 2023-12-12 NOTE — Telephone Encounter (Signed)
 INCOMING CALL NOTE     Caller Name:  Crystal Haney    Relationship of Caller to Patient:   self    Return number:  [phone]    Reason for Call:  Pt called in returning call o Darelene. Please call pt back after 11am    Is the reason for call a hot word listed on the triage list?  No          Staff Member Action:  MA    -------------    Is a visit being scheduled within the next 48 hours?  No   If yes, please ensure that the "Travel Screening" questionnaire is completed on the phone with the patient.

## 2023-12-12 NOTE — Telephone Encounter (Signed)
 Pt is returning a call to Darlene. She was not available. Plz call pt

## 2023-12-14 NOTE — Telephone Encounter (Signed)
[id number] [phone]. ( See result notes). Travonte Byard,G RN 12/14/2023 8:50 AM

## 2023-12-18 ENCOUNTER — Ambulatory Visit: Admit: 2023-12-18 | Payer: MEDICARE | Attending: Family | Admitting: Family | Primary: Family

## 2023-12-18 VITALS — BP 130/64 | HR 87 | Resp 18 | Ht 61.0 in | Wt 139.0 lb

## 2023-12-18 DIAGNOSIS — Z0000 Encounter for general adult medical examination without abnormal findings: Principal | ICD-10-CM

## 2023-12-18 MED ORDER — TETANUS-DIPHTH-ACELL PERTUSSIS 5-2.5-18.5 LF-MCG/0.5 IM SUSP
5-2.5-18.5 | Freq: Once | INTRAMUSCULAR | 0 refills | Status: AC
Start: 2023-12-18 — End: 2023-12-18

## 2023-12-18 NOTE — Progress Notes (Addendum)
 [facility]   [address]  [place] [address]    MEDICARE ANNUAL WELLNESS VISIT       CHIEF COMPLAINT   Crystal Haney is a 78 y.o. female who presents today for Medicare AWV     HISTORY OF PRESENT ILLNESS          HYPERTENSION:    She reports good medication compliance and no side effects.   Home blood pressure monitoring is not being performed.  Chest pain:  no  Dizziness:  no  New or worsening leg edema:  no    BP Readings from Last 3 Encounters:   12/18/23 130/64   10/04/23 136/72   05/30/23 126/66     Lab Results   Component Value Date    K 4.0 12/11/2023    CREATININE 0.95 12/11/2023    NA 138 12/11/2023      Hypertension Medications       Angiotensin II Receptor Antagonists       losartan  (COZAAR ) 50 MG tablet TAKE 1 TABLET BY MOUTH EVERY DAY               A1C is 6.0     CHOLESTEROL:    Current medication: This patient does not have an active medication from one of the medication groupers.   She is not taking any lipid lowering medication at this time.  Cardiac Risk Factors include:    The ASCVD Risk score (Arnett DK, et al., 2019) failed to calculate for the following reasons:    The valid total cholesterol range is 130 to 320 mg/dL   Lab Results   Component Value Date    LDL 199.8 12/11/2023    LDL 173.2 08/31/2022    HDL 68 12/11/2023    HDL 59 08/31/2022    TRIG 713 (H) 12/11/2023    TRIG 174 (H) 08/31/2022    ALT 20 12/11/2023        Unable to tolerate statins    She is able to discuss current events and able to draw the clock and provide correct time.    PHYSICAL EXAM   General:  Pleasant, well-dressed and groomed, good historian  Eyes: PERRLA, non-injected  Ears: tympanic membranes are non-erythematous and in the neutral position, EAM without erythema or discharge  Nose: patent  Oropharynx: oral mucosa moist without lesions, no pharyngeal injection, tonsillar hypertrophy or exudate  Neck: no adenopathy, thyromegaly, masses, or bruits  Heart: regular rate and rhythm  without murmur  Lungs: clear to auscultation  Abdomen: soft, nontender, no masses or organomegaly  Extremities: no edema, pulses are normal  Musculoskeletal:  No joint swelling or tenderness. Normal gait.  Neurologic:  CNII-XII intact. Normal strength, sensation and reflexes throughout.  Skin:  No rash or suspicious skin lesions    MEDICATIONS     Current Outpatient Medications   Medication Sig    Tetanus-Diphth-Acell Pertussis (BOOSTRIX) 5-2.5-18.5 LF-MCG/0.5 injection Inject 0.5 mLs into the muscle once for 1 dose    losartan  (COZAAR ) 50 MG tablet TAKE 1 TABLET BY MOUTH EVERY DAY     No current facility-administered medications for this visit.     There are no discontinued medications.    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 Lives with her husband, Noemi who had end stage COPD. Takes care of him. They live with their daughter.  They have 2 supportive sons as well. Close family.    Exercises, eats healthy.    No tobacco, ETOH, illicit substances.    Daved Brock, FNP   (08/22/22, 2:29 PM)     FAMILY HISTORY     Family History   Problem Relation Age of Onset    Cancer Father     Stroke Sister     Heart Attack Brother      VITALS     Vitals:    12/18/23 1405   BP:  130/64   Site: Left Upper Arm   Position: Sitting   Cuff Size: Medium Adult   Pulse: 87   Resp: 18   SpO2: 94%   Weight: 63 kg (139 lb)   Height: 1.549 m (5' 1)    - Body mass index is 26.26 kg/m.    SCREENING  QUESTIONNAIRES     STOP-BANG (3+ high risk)        No data to display                 DEPRESSION SCREENING  Severity:  1-4 low risk; 5-9 mild depressive symptoms; 10-14 mild major depression; 15-19 moderate major depression; 20-27 severe major depression      12/15/2023     1:43 PM   PHQ-9    Little interest or pleasure in doing things 0   Feeling down, depressed, or hopeless 0   PHQ-2 Score 0   PHQ-9 Total Score 0        MEDICARE ANNUAL WELLNESS VISIT HEALTH RISK ASSESSMENT & PLAN     Recommendations for Preventive Services Due: see orders and patient instructions/AVS.   Recommended screening schedule for the next 5-10 years is provided to the patient in written form: see Patient Instructions/AVS.   Patient's complete Health Risk Assessment and screening values have been reviewed and are found in Flowsheets. The following problems were reviewed today and where indicated follow up appointments were made and/or referrals ordered.     Positive Risk Factor Screenings with Interventions:             General Health and ACP:  General  What matters most to you?: Exercise and healthy alternatives  In general, how would you say your health is?: Very Good  In the past 7 days, have you experienced any of the following: New or Increased Pain, New or Increased Fatigue, Loneliness, Social Isolation, Stress or Anger?: No  Do you have a Living Will?: (!) No    Advance Directives       Power of  Attorney Living Will ACP-Advance Directive ACP-Power of Attorney    Not on File Not on File Not on File Not on File        General Health Risk Interventions:  She is requesting a dietary referral    Activity, Diet, and Weight:    Poor Eating Habits/Diet:  Do you eat balanced/healthy meals regularly?: (!) No      Health Habits/Nutrition  Interventions:  Nutritional issues:  ordered a dietary referral       Sexual Health: She  reports that she is not currently sexually active.         CareTeam (Including outside providers/suppliers regularly involved in providing care):   Patient Care Team:  Nyzier Boivin J, FNP as PCP - General (Nurse Practitioner Family)    ASSESSMENT AND PLAN     1. Encounter for immunization  -     Tetanus-Diphth-Acell Pertussis (BOOSTRIX) 5-2.5-18.5 LF-MCG/0.5 injection; Inject 0.5 mLs into the muscle once for 1 dose, Disp-0.5 mL, R-0Normal  2. Need for hepatitis C screening test  -     Hepatitis C Antibody; Future  3. Initial Medicare annual wellness visit  4. Hyperlipidemia, unspecified hyperlipidemia type  Assessment & Plan:   Historically intolerable to statin.  Lipids ordered.  Continue with exercise, diet modifications.    Ordered Lipid panel and sent referral for nutrition.  Orders:  -     Lipid Panel W/ Reflex Direct LDL; Future  -     External Referral To Nutrition Services  5. Pre-diabetes  Assessment & Plan:   A1C of 6.0 post holiday season.  Will recheck A1C in three months  Orders:  -     Hemoglobin A1C; Future  6. Annual physical exam  7. Primary hypertension  Assessment & Plan:   Chronic, at goal (stable), continue current treatment plan       Follow up:  Return in about 1 year (around 12/17/2024) for AWV, *[id number]*.     No future appointments.    Keenan Dimitrov J Karna Abed, FNP  12/18/2023

## 2023-12-18 NOTE — Assessment & Plan Note (Addendum)
Historically intolerable to statin.  Lipids ordered.  Continue with exercise, diet modifications.    Ordered Lipid panel and sent referral for nutrition.

## 2023-12-18 NOTE — Assessment & Plan Note (Signed)
 Chronic, at goal (stable), continue current treatment plan

## 2023-12-18 NOTE — Assessment & Plan Note (Signed)
 A1C of 6.0 post holiday season.  Will recheck A1C in three months

## 2023-12-18 NOTE — Patient Instructions (Signed)
 Eating Healthy Foods: Care Instructions  With every meal, you can make healthy food choices. Try to eat a variety of fruits, vegetables, whole grains, lean proteins, and low-fat dairy products. This can help you get the right balance of nutrients, including vitamins and minerals. Small changes add up over time. You can start by adding one healthy food to your meals each day.    Try to make half your plate fruits and vegetables, one-fourth whole grains, and one-fourth lean proteins. Try including dairy with your meals.   Eat more fruits and vegetables. Try to have them with most meals and snacks.   Foods for healthy eating        Fruits   These can be fresh, frozen, canned, or dried.  Try to choose whole fruit rather than fruit juice.  Eat a variety of colors.        Vegetables   These can be fresh, frozen, canned, or dried.  Beans, peas, and lentils count too.        Whole grains   Choose whole-grain breads, cereals, and noodles.  Try brown rice.        Lean proteins   These can include lean meat, poultry, fish, and eggs.  You can also have tofu, beans, peas, lentils, nuts, and seeds.        Dairy   Try milk, yogurt, and cheese.  Choose low-fat or fat-free when you can.  If you need to, use lactose-free milk or fortified plant-based milk products, such as soy milk.        Water   Drink water when you're thirsty.  Limit sugar-sweetened drinks, including soda, fruit drinks, and sports drinks.  Where can you learn more?  Go to [employer] and enter T756 to learn more about Eating Healthy Foods: Care Instructions.  Current as of: August 31, 2022  Content Version: 14.2   [address] [place].   Care instructions adapted under license by [facility]. If you have questions about a medical condition or this instruction, always ask your healthcare professional. [employer] disclaims any warranty or liability for your use of this information.           Advance  Directives: Care Instructions  Overview  An advance directive is a legal way to state your wishes at the end of your life. It tells your family and your doctor what to do if you can't say what you want.  There are two main types of advance directives. You can change them any time your wishes change.  Living will.  This form tells your family and your doctor your wishes about life support and other treatment. The form is also called a declaration.  Medical power of attorney.  This form lets you name a person to make treatment decisions for you when you can't speak for yourself. This person is called a health care agent (health care proxy, health care surrogate). The form is also called a durable power of attorney for health care.  If you do not have an advance directive, decisions about your medical care may be made by a family member, or by a doctor or a judge who doesn't know you.  It may help to think of an advance directive as a gift to the people who care for you. If you have one, they won't have to make tough decisions by themselves.  For more information, including forms for your state, see the [employer] website (plumberbiz.com.cy).  Follow-up care is a  key part of your treatment and safety. Be sure to make and go to all appointments, and call your doctor if you are having problems. It's also a good idea to know your test results and keep a list of the medicines you take.  What should you include in an advance directive?  Many states have a unique advance directive form. (It may ask you to address specific issues.) Or you might use a universal form that's approved by many states.  If your form doesn't tell you what to address, it may be hard to know what to include in your advance directive. Use the questions below to help you get started.  Who do you want to make decisions about your medical care if you are not able to?  What life-support measures do you want if you have a  serious illness that gets worse over time or can't be cured?  What are you most afraid of that might happen? (Maybe you're afraid of having pain, losing your independence, or being kept alive by machines.)  Where would you prefer to die? (Your home? A hospital? A nursing home?)  Do you want to donate your organs when you die?  Do you want certain religious practices performed before you die?  When should you call for help?  Be sure to contact your doctor if you have any questions.  Where can you learn more?  Go to [employer] and enter R264 to learn more about Advance Directives: Care Instructions.  Current as of: October 27, 2022  Content Version: 14.2   [address] [place].   Care instructions adapted under license by [facility]. If you have questions about a medical condition or this instruction, always ask your healthcare professional. [employer] disclaims any warranty or liability for your use of this information.           A Healthy Heart: Care Instructions  Overview     Coronary artery disease, also called heart disease, occurs when a substance called plaque builds up in the vessels that supply oxygen-rich blood to your heart muscle. This can narrow the blood vessels and reduce blood flow. A heart attack happens when blood flow is completely blocked. A high-fat diet, smoking, and other factors increase the risk of heart disease.  Your doctor has found that you have a chance of having heart disease. A heart-healthy lifestyle can help keep your heart healthy and prevent heart disease. This lifestyle includes eating healthy, being active, staying at a weight that's healthy for you, and not smoking or using tobacco. It also includes taking medicines as directed, managing other health conditions, and trying to get a healthy amount of sleep.  Follow-up care is a key part of your treatment and safety. Be sure to make and go to all appointments, and call your  doctor if you are having problems. It's also a good idea to know your test results and keep a list of the medicines you take.  How can you care for yourself at home?  Diet   Use less salt when you cook and eat. This helps lower your blood pressure. Taste food before salting. Add only a little salt when you think you need it. With time, your taste buds will adjust to less salt.    Eat fewer snack items, fast foods, canned soups, and other high-salt, high-fat, processed foods.    Read food labels and try to avoid saturated and trans fats. They increase your risk of heart disease  by raising cholesterol levels.    Limit the amount of solid fat--butter, margarine, and shortening--you eat. Use olive, peanut, or canola oil when you cook. Bake, broil, and steam foods instead of frying them.    Eat a variety of fruit and vegetables every day. Dark green, deep orange, red, or yellow fruits and vegetables are especially good for you. Examples include spinach, carrots, peaches, and berries.    Foods high in fiber can reduce your cholesterol and provide important vitamins and minerals. High-fiber foods include whole-grain cereals and breads, oatmeal, beans, brown rice, citrus fruits, and apples.    Eat lean proteins. Heart-healthy proteins include seafood, lean meats and poultry, eggs, beans, peas, nuts, seeds, and soy products.    Limit drinks and foods with added sugar. These include candy, desserts, and soda pop.   Heart-healthy lifestyle   If your doctor recommends it, get more exercise. For many people, walking is a good choice. Or you may want to swim, bike, or do other activities. Bit by bit, increase the time you're active every day. Try for at least 30 minutes on most days of the week.    Try to quit or cut back on using tobacco and other nicotine products. This includes smoking and vaping. If you need help quitting, talk to your doctor about stop-smoking programs and medicines. These can increase your chances  of quitting for good. Quitting is one of the most important things you can do to protect your heart. It is never too late to quit. Try to avoid secondhand smoke too.    Stay at a weight that's healthy for you. Talk to your doctor if you need help losing weight.    Try to get 7 to 9 hours of sleep each night.    Limit alcohol to 2 drinks a day for men and 1 drink a day for women. Too much alcohol can cause health problems.    Manage other health problems such as diabetes, high blood pressure, and high cholesterol. If you think you may have a problem with alcohol or drug use, talk to your doctor.   Medicines   Take your medicines exactly as prescribed. Call your doctor if you think you are having a problem with your medicine.    If your doctor recommends aspirin, take the amount directed each day. Make sure you take aspirin and not another kind of pain reliever, such as acetaminophen (Tylenol).   When should you call for help?   Call 911 if you have symptoms of a heart attack. These may include:   Chest pain or pressure, or a strange feeling in the chest.    Sweating.    Shortness of breath.    Pain, pressure, or a strange feeling in the back, neck, jaw, or upper belly or in one or both shoulders or arms.    Lightheadedness or sudden weakness.    A fast or irregular heartbeat.   After you call 911, the operator may tell you to chew 1 adult-strength or 2 to 4 low-dose aspirin. Wait for an ambulance. Do not try to drive yourself.  Watch closely for changes in your health, and be sure to contact your doctor if you have any problems.  Where can you learn more?  Go to [employer] and enter F075 to learn more about A Healthy Heart: Care Instructions.  Current as of: June 04, 2022  Content Version: 14.2   [address], [place].   Care instructions adapted under license  by [facility]. If you have questions about a medical condition or this instruction, always ask your healthcare  professional. [employer] disclaims any warranty or liability for your use of this information.      Personalized Preventive Plan for Crystal Haney - 12/18/2023  Medicare offers a range of preventive health benefits. Some of the tests and screenings are paid in full while other may be subject to a deductible, co-insurance, and/or copay.  Some of these benefits include a comprehensive review of your medical history including lifestyle, illnesses that may run in your family, and various assessments and screenings as appropriate.  After reviewing your medical record and screening and assessments performed today your provider may have ordered immunizations, labs, imaging, and/or referrals for you.  A list of these orders (if applicable) as well as your Preventive Care list are included within your After Visit Summary for your review.

## 2023-12-28 NOTE — Telephone Encounter (Signed)
Chart review reveals that results were reviewed with pt:  Per: Yancey Flemings, RN  12/14/2023  3:23 PM EST     Will close this note for administrative purposes.  Thank-you  --  Verlin Fester RN float nurse helping remote for [facility] today until 2pm ext [phone]

## 2024-02-13 ENCOUNTER — Encounter

## 2024-02-15 MED ORDER — LOSARTAN POTASSIUM 50 MG PO TABS
50 | ORAL_TABLET | Freq: Every day | ORAL | 3 refills | Status: AC
Start: 2024-02-15 — End: ?

## 2024-02-15 NOTE — Telephone Encounter (Signed)
 Name from pharmacy: LOSARTAN POTASSIUM 50 MG TAB          Will file in chart as: losartan (COZAAR) 50 MG tablet    Sig: TAKE 1 TABLET BY MOUTH EVERY DAY    Disp: 90 tablet    Refills: 1    Start: 02/13/2024    Class: Normal    Non-formulary For: Primary hypertension    Last ordered: 5 months ago (08/21/2023) by April J Damboise, FNP    Last refill: 11/17/2023    Rx #: [id number]    ARB Refill Protocol Passed03/03/2024 12:55 AM   Protocol Details Last creatinine level resulted within the past 12 months    Last potassium level normal, within the past 12 months    Visit with authorizing provider in past 9 months or upcoming 90 days      To be filled at: [id number] [place], [place] - [address] [place]etrius Charity [phone] - F [phone]       Last Ov:12/18/2023    Next OV:03/19/2024  Rx written and faxed to pharmacy  Grayce Sessions, CCMA 02/15/2024 10:37 AM

## 2024-03-12 ENCOUNTER — Inpatient Hospital Stay: Admit: 2024-03-12 | Payer: MEDICARE | Primary: Family

## 2024-03-12 DIAGNOSIS — Z1159 Encounter for screening for other viral diseases: Secondary | ICD-10-CM

## 2024-03-12 LAB — HEPATITIS C ANTIBODY: Hepatitis C Ab: NONREACTIVE

## 2024-03-12 LAB — LIPID PANEL
Chol/HDL Ratio: 4.3
Cholesterol, Total: 301 mg/dL — ABNORMAL HIGH (ref 0–199)
HDL: 70 mg/dL (ref >50)
LDL Cholesterol: 195.6 mg/dL
Non-HDL Cholesterol: 231 mg/dL
Triglycerides: 177 mg/dL — ABNORMAL HIGH (ref <150)

## 2024-03-12 LAB — HEMOGLOBIN A1C
Estimated Avg Glucose: 120 mg/dL
Hemoglobin A1C: 5.8 % — ABNORMAL HIGH (ref <5.7)

## 2024-03-18 NOTE — Other (Signed)
 A1c is good however her cholesterol is high.  Can you see if she is amendable to taking a statin?

## 2024-03-19 ENCOUNTER — Ambulatory Visit: Admit: 2024-03-19 | Discharge: 2024-03-19 | Payer: MEDICARE | Attending: Family | Primary: Family

## 2024-03-19 VITALS — BP 134/80 | HR 73 | Resp 18 | Wt 129.0 lb

## 2024-03-19 DIAGNOSIS — E785 Hyperlipidemia, unspecified: Secondary | ICD-10-CM

## 2024-03-19 NOTE — Progress Notes (Signed)
 CHIEF COMPLAINT   Crystal Haney is a 78 y.o. female who presents today for follow-up of 3 Month Follow-Up (Review labs )    HISTORY OF PRESENT ILLNESS     History of Present Illness  The patient is a 78 year old female who presents for a follow-up on her lab results.    She has been experiencing a pins-and-needles sensation, which she attributes to her statin medication. She discontinued the use of statins while residing in [place] , approximately 5 years ago. She has trialed 4 different statins.    Her A1c level was previously recorded at 6.1, but she believes it has since decreased to 5.9. She has made significant dietary modifications, including the elimination of soft drinks and pastries from her diet. She consumes mashed potatoes with margarine once every other week and uses olive oil exclusively. Her diet also includes skim milk, 50-calorie ice cream, brown rice, and a limited amount of meat. She consumes 1 boiled egg daily and avoids processed meats such as bologna and ham. Her diet also includes cottage cheese, 100-calorie oatmeal packets, and a daily tablespoon of raw honey. She enjoys a variety of fruits, including blueberries, raspberries, and strawberries, and has recently added fat-free cranberries to her diet. She has discontinued the consumption of raisins due to their sugar content. She does not consume bacon and limits her pizza intake to 2 slices every 2 to 3 weeks. She occasionally enjoys sweet potatoes with butter and cinnamon. She consumes protein drinks and vanilla-flavored protein powder. She maintains an active lifestyle, including walking her dogs and visiting [employer] during the spring season. She also frequents the [facility] for exercise.    Supplemental Information  She is currently taking potassium and calcium supplements.    MEDICATIONS  Current: Potassium, calcium.  Discontinued: Statins.      PHYSICAL EXAM     Physical Exam     deferred    VITALS     Vitals:     03/19/24 1307   BP: 134/80   BP Site: Right Upper Arm   Patient Position: Sitting   BP Cuff Size: Medium Adult   Pulse: 73   Resp: 18   SpO2: 96%   Weight: 58.5 kg (129 lb)    - Body mass index is 24.37 kg/m.    RESULTS     Results  Laboratory Studies  A1c was 5.8.       MEDICATIONS     Current Outpatient Medications   Medication Sig    losartan (COZAAR) 50 MG tablet TAKE 1 TABLET BY MOUTH EVERY DAY     No current facility-administered medications for this visit.     There are no discontinued medications.    ALLERGIES     Allergies   Allergen Reactions    Ampicillin Hives    Statins Other (See Comments)     Feels like pins and needles picking at skin.     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 Lives with her husband, Gaye Kato who had end stage COPD. Takes care of him. They live with their daughter.  They have 2 supportive sons as well. Close family.    Exercises, eats healthy.    No tobacco, ETOH, illicit substances.    Francisca Irvine, FNP   (08/22/22, 2:29 PM)       ASSESSMENT AND PLAN     Assessment & Plan  1. Cholesterol management.  Her triglyceride levels have shown a significant decrease, indicating a positive response to her current dietary modifications. She is advised to substitute margarine with butter in her diet and to consume regular oats instead of oatmeal packets. The inclusion of fresh fruits such as blueberries, raspberries, and strawberries in her diet is recommended. She is encouraged to continue her walking regimen for further reduction in triglyceride levels. A repeat cholesterol test  will be conducted in 6 months.    2. Prediabetes.  Her A1c level is currently at 5.8, which places her in the prediabetic range. She is advised to maintain her current dietary modifications, including avoiding soft drinks and pastries, and continuing to consume whole foods. She should monitor her intake of high-sugar foods like raisins and consider alternatives like apricots. Regular physical activity, such as walking, is also recommended to help manage her blood sugar levels.  1. Hyperlipidemia, unspecified hyperlipidemia type  Assessment & Plan:   Her triglyceride levels have shown a significant decrease, indicating a positive response to her current dietary modifications. She is advised to substitute margarine with butter in her diet and to consume regular oats instead of oatmeal packets. The inclusion of fresh fruits such as blueberries, raspberries, and strawberries in her diet is recommended. She is encouraged to continue her walking regimen for further reduction in triglyceride levels. A repeat cholesterol test will be conducted in 6 months.  Orders:  -     Lipid Panel W/ Reflex Direct LDL; Future  2. Pre-diabetes  Assessment & Plan:   Her A1c level is currently at 5.8, which places her in the prediabetic range. She is advised to maintain her current dietary modifications, including avoiding soft drinks and pastries, and continuing to consume whole foods. She should monitor her intake of high-sugar foods like raisins and consider alternatives like apricots. Regular physical activity, such as walking, is also recommended to help manage her blood sugar levels.       Follow up:  Return in about 6 months (around 09/18/2024) for CHOL.     Future Appointments   Date Time Provider Department Center   09/20/2024  8:40 AM Mertie Abt, [place] [facility] AMB   12/20/2024  1:00 PM Trellis Vanoverbeke J, FNP [facility] AMB       The patient (or guardian, if applicable) and other individuals in attendance with the patient were  advised that Artificial Intelligence will be utilized during this visit to record, process the conversation to generate a clinical note, and support improvement of the AI technology. The patient (or guardian, if applicable) and other individuals in attendance at the appointment consented to the use of AI, including the recording.

## 2024-03-20 NOTE — Other (Signed)
 Pt discussed this with patient at OV yesterday, added to allergies as she does not tolerate statins   03/20/2024 10:30 AM

## 2024-03-25 NOTE — Assessment & Plan Note (Signed)
 Her A1c level is currently at 5.8, which places her in the prediabetic range. She is advised to maintain her current dietary modifications, including avoiding soft drinks and pastries, and continuing to consume whole foods. She should monitor her intake of high-sugar foods like raisins and consider alternatives like apricots. Regular physical activity, such as walking, is also recommended to help manage her blood sugar levels.

## 2024-03-25 NOTE — Assessment & Plan Note (Signed)
 Her triglyceride levels have shown a significant decrease, indicating a positive response to her current dietary modifications. She is advised to substitute margarine with butter in her diet and to consume regular oats instead of oatmeal packets. The inclusion of fresh fruits such as blueberries, raspberries, and strawberries in her diet is recommended. She is encouraged to continue her walking regimen for further reduction in triglyceride levels. A repeat cholesterol test will be conducted in 6 months.

## 2024-08-15 ENCOUNTER — Encounter

## 2024-09-20 ENCOUNTER — Encounter: Payer: Medicare (Managed Care) | Attending: Family | Primary: Family

## 2024-09-23 ENCOUNTER — Encounter: Payer: Medicare (Managed Care) | Attending: Family | Primary: Family

## 2024-10-01 ENCOUNTER — Ambulatory Visit: Admit: 2024-10-01 | Discharge: 2024-10-01 | Payer: Medicare (Managed Care) | Attending: Family | Primary: Family

## 2024-10-01 NOTE — Assessment & Plan Note (Signed)
"   Chronic, at goal (stable), continue current treatment plan         "

## 2024-10-01 NOTE — Assessment & Plan Note (Signed)
"   2. Elevated Cholesterol:  - Her cholesterol level was high at 301 in [place] H[place]. She has opted not to start any medication and has made some dietary changes.  - An updated cholesterol test is recommended sooner rather than later to monitor her levels.  "

## 2024-10-01 NOTE — Assessment & Plan Note (Signed)
"   Monitored by specialist- no acute findings meriting change in the plan         "

## 2024-10-01 NOTE — Progress Notes (Signed)
 "  CHIEF COMPLAINT   Crystal Haney is a 78 y.o. female who presents today for follow-up of 6 Month Follow-Up (Chronic conditions) and Cholesterol Problem (hyperlipidemia)    HISTORY OF PRESENT ILLNESS     History of Present Illness        CHOLESTEROL:    Current medication: This patient does not have an active medication from one of the medication groupers.   She is not taking any lipid lowering medication at this time.  Cardiac Risk Factors include:    The 10-year ASCVD risk score (Arnett DK, et al., 2019) is: 26.7%    Values used to calculate the score:      Age: 89 years      Clinically relevant sex: Female      Is Non-Hispanic African American: No      Diabetic: No      Tobacco smoker: No      Systolic Blood Pressure: 136 mmHg      Is BP treated: Yes      HDL Cholesterol: 70 MG/DL      Total Cholesterol: 301 MG/DL   Lab Results   Component Value Date    LDL 195.6 03/12/2024    LDL 199.8 12/11/2023    HDL 70 03/12/2024    HDL 68 12/11/2023    TRIG 822 (H) 03/12/2024    TRIG 286 (H) 12/11/2023    ALT 20 12/11/2023             HYPERTENSION:    She reports good medication compliance and no side effects.   Home blood pressure monitoring is being performed and the systolic bp is running in the 871 to 134 range.  Chest pain:  no  Heart palpitations:  no  Dizziness:  no  New or worsening leg edema:  no    BP Readings from Last 3 Encounters:   10/01/24 136/60   03/19/24 134/80   12/18/23 130/64     Lab Results   Component Value Date    K 4.0 12/11/2023    CREATININE 0.95 12/11/2023    NA 138 12/11/2023      Hypertension Medications       Angiotensin II Receptor Antagonists       losartan  (COZAAR ) 50 MG tablet TAKE 1 TABLET BY MOUTH EVERY DAY                   The patient is a 78 year old female who presents for follow-up.    She has been diagnosed with macular degeneration but reports no signs of glaucoma. Her intraocular pressure is within normal limits, and she does not require corrective eyewear, only reading  glasses. Her cataracts are not yet at a stage requiring intervention. She has recently changed her ophthalmologist due to her previous doctor's relocation to [place].    Her cholesterol was high in 03/2024, but she opted not to take medication. She has made dietary modifications and maintains an active lifestyle, including walking approximately 10,000 steps daily during her recent vacation in [place]  and [place], [place] . She continues to monitor her blood pressure at home, which typically reads around 130/70. She reports no chest pain, palpitations, dizziness, or new or worsening leg swelling. She also engages in regular exercise using a [employer] at home.    She has a history of skin cancer, which was initially dismissed as a minor issue by her previous physician. She now consults with a dermatologist annually and has an appointment scheduled for next  year. She had a biopsy done on a lesion that was found to be noncancerous.    She has recently recovered from a cold, which caused some hoarseness. She plans to receive her COVID-19 and influenza vaccines after her upcoming mammogram. She has received all her vaccines in 2022 when she lived in [place] .    Hobbies: Miniature golfing, paddle boating, walking      PHYSICAL EXAM     Physical Exam  Mouth/Throat: Hoarseness noted.   deferred    VITALS     Vitals:    10/01/24 1413   BP: 136/60   BP Site: Left Upper Arm   Patient Position: Sitting   BP Cuff Size: Medium Adult   Pulse: 87   SpO2: 98%   Weight: 61.2 kg (135 lb)   Height: 1.549 m (5' 1)    - Body mass index is 25.51 kg/m.    RESULTS     Results  Labs   - Cholesterol: 03/2024, 301 mg/dL       MEDICATIONS     Current Outpatient Medications   Medication Sig    losartan  (COZAAR ) 50 MG tablet TAKE 1 TABLET BY MOUTH EVERY DAY     No current facility-administered medications for this visit.     There are no discontinued medications.    ALLERGIES     Allergies   Allergen Reactions    Ampicillin Hives     Statins Other (See Comments)     Feels like pins and needles picking at skin.     ACTIVE MEDICAL PROBLEMS     Patient Active Problem List   Diagnosis    Primary hypertension    Raised intraocular pressure of both eyes    Hyperlipidemia    Osteopenia    Nervousness    Grief    Skin lesions    Post-menopausal    Pre-diabetes    Encounter for immunization     SOCIAL HISTORY     Social History     Social History Narrative    Moved back to [place]  last year (2022). Lives with her husband, Noemi who had end stage COPD. Takes care of him. They live with their daughter.  They have 2 supportive sons as well. Close family.    Exercises, eats healthy.    No tobacco, ETOH, illicit substances.    Daved Brock, FNP   (08/22/22, 2:29 PM)       ASSESSMENT AND PLAN     Assessment & Plan          1. Primary hypertension  Assessment & Plan:   Chronic, at goal (stable), continue current treatment plan  2. Hyperlipidemia, unspecified hyperlipidemia type  Assessment & Plan:   2. Elevated Cholesterol:  - Her cholesterol level was high at 301 in [place]. She has opted not to start any medication and has made some dietary changes.  - An updated cholesterol test is recommended sooner rather than later to monitor her levels.  3. Skin lesions  Assessment & Plan:   Monitored by specialist- no acute findings meriting change in the plan       Follow up:  No follow-ups on file.     Future Appointments   Date Time Provider Department Center   10/04/2024  8:00 AM [facility]   12/20/2024  1:00 PM Jerilee Space J, FNP [facility]       The patient (or guardian, if applicable) and other individuals in attendance with  the patient were advised that Artificial Intelligence will be utilized during this visit to record, process the conversation to generate a clinical note, and support improvement of the AI technology. The patient (or guardian, if applicable) and other individuals in attendance at the appointment consented to the use of AI,  including the recording.       "

## 2024-10-04 ENCOUNTER — Inpatient Hospital Stay: Admit: 2024-10-04 | Payer: Medicare (Managed Care) | Attending: Family | Primary: Family

## 2024-10-04 DIAGNOSIS — Z1231 Encounter for screening mammogram for malignant neoplasm of breast: Principal | ICD-10-CM

## 2024-12-20 ENCOUNTER — Ambulatory Visit: Admit: 2024-12-20 | Discharge: 2024-12-20 | Payer: Medicare (Managed Care) | Attending: Family | Primary: Family

## 2024-12-20 NOTE — Assessment & Plan Note (Signed)
"   Chronic, at goal (stable), continue current treatment plan         "

## 2024-12-20 NOTE — Progress Notes (Signed)
 MEDICARE ANNUAL WELLNESS VISIT       CHIEF COMPLAINT   Crystal Haney is a 79 y.o. female who presents today for Medicare AWV     HISTORY OF PRESENT ILLNESS     History of Present Illness  The patient is a 79 year old female who presents for her annual wellness visit.    She reports experiencing multiple deaths in her social circle over the past couple of years, including a niece who passed away on 01/11/2025 and a best friend who died in 2024-04-29 from metastatic cancer. Despite these losses, she maintains an active lifestyle and overall good health.    She does not monitor her blood pressure at home but reports it was normal when checked. She reports no chest pain, heart palpitations, dizziness, or leg swelling. She has an ophthalmologist and plans to schedule an appointment. She reports no urinary or bowel issues.    She has been consuming protein drinks and has incorporated a variety of fruits and vegetables into her diet, including carrots, celery, kale, spinach, nuts, banana, apple, cherries, flaxseed, and 30 g of protein powder. She has been doing this for approximately 2.5 months. She engages in regular physical activity, including stretching exercises, yoga, core strengthening exercises, and participation in the [employer] program. She has a dental plate that fits well.    She has a small skin lesion that was evaluated by a dermatologist and deemed non-concerning. She has a follow-up appointment with the dermatologist scheduled for 04/11/2025.    Social History:  Hobbies: Stretching exercises, yoga, core strengthening exercises, [employer] program  Diet: Consumes protein drinks with fruits and vegetables  Coffee/Tea/Caffeine-containing Drinks: Drinks coffee daily       HYPERTENSION:    She reports good medication compliance and no side effects.   Home blood pressure monitoring is not being performed.  Chest pain:  no  Heart palpitations:  no  Dizziness:  no  New or worsening leg edema:  no    BP  Readings from Last 3 Encounters:   12/20/24 130/68   10/01/24 136/60   03/19/24 134/80     Lab Results   Component Value Date    K 4.0 12/11/2023    CREATININE 0.95 12/11/2023    NA 138 12/11/2023      Hypertension Medications       Angiotensin II Receptor Antagonists       losartan  (COZAAR ) 50 MG tablet TAKE 1 TABLET BY MOUTH EVERY DAY                 PHYSICAL EXAM     Physical Exam       General:  Pleasant, well-dressed and groomed, good historian  Eyes: PERRLA, non-injected  Ears: tympanic membranes are non-erythematous and in the neutral position, EAM without erythema or discharge  Nose: patent  Oropharynx: oral mucosa moist without lesions, no pharyngeal injection, tonsillar hypertrophy or exudate  Neck: no adenopathy, thyromegaly, masses, or bruits  Heart: regular rate and rhythm without murmur  Lungs: clear to auscultation  Abdomen: soft, nontender, no masses or organomegaly  Extremities: no edema, pulses are normal  Musculoskeletal:  No joint swelling or tenderness. Normal gait.  Neurologic:  CNII-XII intact. Normal strength, sensation and reflexes throughout.  Skin:  No rash or suspicious skin lesions  Declines breast exam   VITALS     Vitals:    12/20/24 1234 12/20/24 1239   BP: 126/62 130/68   BP Site: Left Upper Arm Right Upper Arm  Patient Position: Sitting Sitting   BP Cuff Size: Medium Adult Medium Adult   Pulse: 85    SpO2: 99%    Weight: 60.2 kg (132 lb 12.8 oz)    Height: 1.549 m (5' 1)     - Body mass index is 25.09 kg/m.  RESULTS     Results         MEDICATIONS     Current Outpatient Medications   Medication Sig    Multiple Vitamins-Minerals (PRESERVISION AREDS 2+MULTI VIT PO) Take by mouth    losartan  (COZAAR ) 50 MG tablet TAKE 1 TABLET BY MOUTH EVERY DAY     No current facility-administered medications for this visit.     There are no discontinued medications.    ALLERGIES     Allergies   Allergen Reactions    Ampicillin Hives    Statins Other (See Comments)     Feels like pins and needles  picking at skin.     ACTIVE MEDICAL PROBLEMS     Patient Active Problem List   Diagnosis    Medicare annual wellness visit, subsequent    Primary hypertension    Raised intraocular pressure of both eyes    Hyperlipidemia    Osteopenia    Nervousness    Grief    Skin lesions    Post-menopausal    Pre-diabetes    Encounter for immunization     PAST SURGICAL HISTORY     Past Surgical History:   Procedure Laterality Date    CHOLECYSTECTOMY  01/2013    HYSTERECTOMY (CERVIX STATUS UNKNOWN)      PARTIAL HYSTERECTOMY (CERVIX NOT REMOVED)  1972    SKIN CANCER EXCISION  04/2023     SOCIAL HISTORY     Social History     Social History Narrative    Moved back to [place]  last year (2022). Lives with her husband, Noemi who had end stage COPD. Takes care of him. They live with their daughter.  They have 2 supportive sons as well. Close family.    Exercises, eats healthy.    No tobacco, ETOH, illicit substances.    Daved Brock, FNP   (08/22/22, 2:29 PM)     FAMILY HISTORY     Family History   Problem Relation Age of Onset    Cancer Father     Colon Cancer Father     Stroke Sister     Heart Attack Brother        SCREENING  QUESTIONNAIRES     STOP-BANG (3+ high risk)        No data to display                 DEPRESSION SCREENING  Severity:  1-4 low risk; 5-9 mild depressive symptoms; 10-14 mild major depression; 15-19 moderate major depression; 20-27 severe major depression      12/20/2024    12:48 PM   PHQ-9    Little interest or pleasure in doing things 0   Feeling down, depressed, or hopeless 0   PHQ-2 Score 0   PHQ-9 Total Score 0        MEDICARE ANNUAL WELLNESS VISIT HEALTH RISK ASSESSMENT & PLAN     Recommendations for Preventive Services Due: see orders and patient instructions/AVS.   Recommended screening schedule for the next 5-10 years is provided to the patient in written form: see Patient Instructions/AVS.   Patient's complete Health Risk Assessment and screening values have been reviewed and are found in Flowsheets.  The  following problems were reviewed today and where indicated follow up appointments were made and/or referrals ordered.     Positive Risk Factor Screenings with Interventions:     Cognitive:   Clock Drawing Test (CDT): Normal  Words recalled: 3 Words Recalled     Total Score Interpretation: Abnormal Mini-Cog  Interventions:  Patient declines any further evaluation or treatment           General Health and ACP:  General  What matters most to you?: exercise  In general, how would you say your health is?: Very Good  In the past 7 days, have you experienced any of the following: New or Increased Pain, New or Increased Fatigue, Loneliness, Social Isolation, Stress or Anger?: No  Do you have a Living Will?: (!) No (provided today)    Advance Directives       Power of Attorney Living Will ACP-Advance Directive ACP-Power of Attorney    Not on File Not on File Not on File Not on File        General Health Risk Interventions:  No Living Will: Patient declines ACP discussion/assistance     Vision Screen:  Do you have difficulty driving, watching TV, or doing any of your daily activities because of your eyesight?: (!) Yes (macular degeneration - manageable)  Have you had an eye exam within the past year?: Yes    Hearing/Vision Interventions:  Vision concerns:  Patient encouraged to make appointment with their eye specialist      Sexual Health: She  reports that she is not currently sexually active.    CARE TEAM   Patient Care Team:  Jerilynn Feldmeier J, FNP as PCP - General (Nurse Practitioner, Family)    ASSESSMENT AND PLAN     Assessment & Plan      - She has a follow-up appointment with the dermatologist scheduled for 04/11/2025.  1. Medicare annual wellness visit, subsequent  Assessment & Plan:   1. Annual wellness visit:  - She does not check her blood pressure at home but reports no issues such as chest pain, heart palpitations, dizziness, or leg swelling.  - She has been incorporating a blend of carrots, celery, kale,  spinach, nuts, banana, apple, cherries, flaxseed, and protein powder into her diet for the past 2.5 months. She engages in regular physical activity, including yoga and core strengthening exercises at the [facility].  - A cholesterol panel will be ordered to monitor her levels, given her intolerance to statins.  - She is advised to continue her current diet and exercise regimen. An eye doctor appointment is recommended for a routine check-up.  Orders:  -     Hemoglobin A1C; Future  -     CBC with Auto Differential; Future  -     Comprehensive Metabolic Panel w/ Reflex to MG; Future  -     Lipid Panel; Future  2. Primary hypertension  Assessment & Plan:   Chronic, at goal (stable), continue current treatment plan       Follow up:  Return in about 1 year (around 12/20/2025) for AWV.     Future Appointments   Date Time Provider Department Center   06/20/2025  1:00 PM Dresean Beckel J, FNP [facility]   12/26/2025  1:00 PM Milliana Reddoch J, FNP [facility]       The patient (or guardian, if applicable) and other individuals in attendance with the patient were advised that Artificial Intelligence will be utilized during  this visit to record, process the conversation to generate a clinical note, and support improvement of the AI technology. The patient (or guardian, if applicable) and other individuals in attendance at the appointment consented to the use of AI, including the recording.

## 2024-12-20 NOTE — Patient Instructions (Signed)
 Hearing Loss: Care Instructions  Overview     Hearing loss is a sudden or slow decrease in how well you hear. It can range from slight to profound. Permanent hearing loss can occur with aging. It also can happen when you are exposed long-term to loud noise. Examples include listening to loud music, riding motorcycles, or being around other loud machines.  Hearing loss can affect your work and home life. It can make you feel lonely or depressed. You may feel that you have lost your independence. But hearing aids and other devices can help you hear better and feel connected to others.  Follow-up care is a key part of your treatment and safety. Be sure to make and go to all appointments, and call your doctor if you are having problems. It's also a good idea to know your test results and keep a list of the medicines you take.  How can you care for yourself at home?  Avoid loud noises whenever possible. This helps keep your hearing from getting worse.  Always wear hearing protection around loud noises.  Wear a hearing aid as directed.  A professional can help you pick a hearing aid that will work best for you.  You can also get hearing aids over the counter for mild to moderate hearing loss.  Have hearing tests as your doctor suggests. They can show whether your hearing has changed. Your hearing aid may need to be adjusted.  Use other devices as needed. These may include:  Telephone amplifiers and hearing aids that can connect to a television, stereo, radio, or microphone.  Devices that use lights or vibrations. These alert you to the doorbell, a ringing telephone, or a baby monitor.  Television closed-captioning. This shows the words at the bottom of the screen. Most new TVs can do this.  TTY (text telephone). This lets you type messages back and forth on the telephone instead of talking or listening. These devices are also called TDD. When messages are typed on the keyboard, they are sent over the phone line to a  receiving TTY. The message is shown on a monitor.  Use text messaging, social media, and email if it is hard for you to communicate by telephone.  Try to learn a listening technique called speechreading. It is not lipreading. You pay attention to people's gestures, expressions, posture, and tone of voice. These clues can help you understand what a person is saying. Face the person you are talking to, and have them face you. Make sure the lighting is good. You need to see the other person's face clearly.  Think about counseling if you need help to adjust to your hearing loss.  When should you call for help?  Watch closely for changes in your health, and be sure to contact your doctor if:    You think your hearing is getting worse.     You have new symptoms, such as dizziness or nausea.   Where can you learn more?  Go to [employer] and enter R798 to learn more about Hearing Loss: Care Instructions.  Current as of: October 08, 2023  Content Version: 14.6   2024-2025 [place], [place].   Care instructions adapted under license by [facility]. If you have questions about a medical condition or this instruction, always ask your healthcare professional. Romayne Alderman, [facility], disclaims any warranty or liability for your use of this information.         Learning About Vision Tests  What are  vision tests?     The four most common vision tests are visual acuity tests, refraction, visual field tests, and color vision tests.  Visual acuity (sharpness) tests  These tests are used:  To see if you need glasses or contact lenses.  To monitor an eye problem.  To check an eye injury.  Visual acuity tests are done as part of routine exams. You may also have this test when you get your driver's license or apply for some types of jobs.  Visual field tests  These tests are used:  To check for vision loss in any area of your range of vision.  To screen for certain eye diseases.  To look for nerve damage  after a stroke, head injury, or other problem that could reduce blood flow to the brain.  Refraction and color tests  A refraction test is done to find the right prescription for glasses and contact lenses.  A color vision test is done to check for color blindness.  Color vision is often tested as part of a routine exam. You may also have this test when you apply for a job where recognizing different colors is important, such as truck driving, optician, dispensing, or the [employer].  How are vision tests done?  Visual acuity test   You cover one eye at a time.  You read aloud from a wall chart across the room.  You read aloud from a small card that you hold in your hand.  Refraction   You look into a special device.  The device puts lenses of different strengths in front of each eye to see how strong your glasses or contact lenses need to be.  Visual field tests   Your doctor may have you look through special machines.  Or your doctor may simply have you stare straight ahead while they move a finger into and out of your field of vision.  Color vision test   You look at pieces of printed test patterns in various colors. You say what number or symbol you see.  Your doctor may have you trace the number or symbol using a pointer.  How do these tests feel?  There is very little chance of having a problem from this test. If dilating drops are used for a vision test, they may make the eyes sting and cause a medicine taste in the mouth.  Follow-up care is a key part of your treatment and safety. Be sure to make and go to all appointments, and call your doctor if you are having problems. It's also a good idea to know your test results and keep a list of the medicines you take.  Where can you learn more?  Go to [employer] and enter G551 to learn more about Learning About Vision Tests.  Current as of: July 12, 2023  Content Version: 14.6   2024-2025 [place], [place].   Care instructions adapted under  license by [facility]. If you have questions about a medical condition or this instruction, always ask your healthcare professional. Romayne Alderman, [facility], disclaims any warranty or liability for your use of this information.         Advance Directives: Care Instructions  Overview  An advance directive is a legal way to state your wishes at the end of your life. It tells your loved ones and doctor what to do if you can't say what you want.  There are two main types of advance directives. You can change them any  time your wishes change.  Living will. This form tells your loved ones and doctor your wishes about life support and other treatment. The form is also called a declaration.  Medical power of attorney. This form lets you name a person to make treatment decisions for you when you can't speak for yourself. This person is called a health care agent (health care proxy, health care surrogate). The form is also called a durable power of attorney for health care.  If you do not have an advance directive, decisions about your medical care may be made by a family member or doctor who doesn't know you or by a judge.  It may help to think of an advance directive as a gift to the people who care for you. If you have one, they won't have to make tough decisions by themselves.  For more information, including forms for your state, see the [employer] website (pl[employer].com.cy).  Follow-up care is a key part of your treatment and safety. Be sure to make and go to all appointments, and call your doctor if you are having problems. It's also a good idea to know your test results and keep a list of the medicines you take.  What should you include in an advance directive?  Many states have a unique advance directive form. (It may ask you to address specific issues.) Or you might use a universal form that's approved by many states.  If your form doesn't tell you what to address, it may be hard to  know what to include in your advance directive. Use the questions below to help you get started.  Who do you want to make decisions about your medical care if you are not able to?  What life-support measures do you want if you have a serious illness that gets worse over time or can't be cured?  What are you most afraid of that might happen? (Maybe you're afraid of having pain, losing your independence, or being kept alive by machines.)  Where would you prefer to die? (Your home? A hospital? A nursing home?)  Do you want to donate your organs when you die?  Do you want certain religious practices performed before you die?  When should you call for help?  Be sure to contact your doctor if you have any questions.  Where can you learn more?  Go to [employer] and enter R264 to learn more about Advance Directives: Care Instructions.  Current as of: June 11, 2024  Content Version: 14.6   2024-2025 [place], [place].   Care instructions adapted under license by [facility]. If you have questions about a medical condition or this instruction, always ask your healthcare professional. Romayne Alderman, [facility], disclaims any warranty or liability for your use of this information.         A Healthy Heart: Care Instructions  Overview    Coronary artery disease, also called heart disease, occurs when a substance called plaque builds up in the vessels that supply oxygen-rich blood to your heart muscle. This can narrow the blood vessels and reduce blood flow. A heart attack happens when blood flow is completely blocked. A high-fat diet, smoking, and other factors increase the risk of heart disease.  Your doctor has found that you have a chance of having heart disease. A heart-healthy lifestyle can help keep your heart healthy and prevent heart disease. This lifestyle includes eating healthy, being active, staying at a weight that's healthy for you, and not smoking, vaping,  or using other tobacco or  nicotine products. It also includes taking medicines as directed, managing other health conditions, and trying to get a healthy amount of sleep.  Follow-up care is a key part of your treatment and safety. Be sure to make and go to all appointments, and contact your doctor if you are having problems. It's also a good idea to know your test results and keep a list of the medicines you take.  How can you care for yourself at home?  Diet  Use less salt when you cook and eat. This helps lower your blood pressure. Taste food before salting. Add only a little salt when you think you need it. With time, your taste buds will adjust to less salt.  Eat fewer snack items, fast foods, canned soups, and other high-salt, high-fat, processed foods.  Read food labels and try to avoid saturated and trans fats. They increase your risk of heart disease by raising cholesterol levels.  Limit the amount of solid fat--butter, margarine, and shortening--you eat. Use olive, peanut, or canola oil when you cook. Bake, broil, and steam foods instead of frying them.  Eat a variety of fruit and vegetables every day. Dark green, deep orange, red, or yellow fruits and vegetables are especially good for you. Examples include spinach, carrots, peaches, and berries.  Foods high in fiber can reduce your cholesterol and provide important vitamins and minerals. High-fiber foods include whole-grain cereals and breads, oatmeal, beans, brown rice, citrus fruits, and apples.  Eat lean proteins. Heart-healthy proteins include seafood, lean meats and poultry, eggs, beans, peas, nuts, seeds, and soy products.  Limit drinks and foods with added sugar. These include candy, desserts, and soda pop.  Heart-healthy lifestyle  If your doctor recommends it, get more exercise. For many people, walking is a good choice. Or you may want to swim, bike, or do other activities. Bit by bit, increase the time you're active every day. Try for at least 30 minutes on most days of  the week.  If you smoke, vape, or use other tobacco or nicotine products, try to quit. If you cant quit, cut back as much as you can. If you need help quitting, talk to your doctor about quit programs and medicines. Quitting is one of the most important things you can do to protect your heart. Also avoid secondhand smoke and the aerosol mist from vaping.  Stay at a weight that's healthy for you. Talk to your doctor if you need help losing weight.  Try to get 7 to 9 hours of sleep each night.  Limit alcohol to 2 drinks a day for men and 1 drink a day for women. Too much alcohol can cause health problems.  Manage other health problems such as diabetes, high blood pressure, and high cholesterol. If you think you may have a problem with alcohol or drug use, talk to your doctor.  Medicines  Take your medicines exactly as prescribed. Contact your doctor if you think you are having a problem with your medicine.  When should you call for help?  Call 911 if you have symptoms of a heart attack. These may include:  Chest pain or pressure, or a strange feeling in the chest.  Sweating.  Shortness of breath.  Pain, pressure, or a strange feeling in the back, neck, jaw, or upper belly or in one or both shoulders or arms.  Lightheadedness or sudden weakness.  A fast or irregular heartbeat.  After you call 911, the operator  may tell you to chew 1 adult-strength or 2 to 4 low-dose aspirin. Wait for an ambulance. Do not try to drive yourself.  Watch closely for changes in your health, and be sure to contact your doctor if you have any problems.  Where can you learn more?  Go to [employer] and enter F075 to learn more about A Healthy Heart: Care Instructions.  Current as of: July 12, 2023  Content Version: 14.6   2024-2025 [place], [place].   Care instructions adapted under license by [facility]. If you have questions about a medical condition or this instruction, always ask your healthcare  professional. Romayne Alderman,  [facility], disclaims any warranty or liability for your use of this information.    Personalized Preventive Plan for Crystal Haney - 12/20/2024  Medicare offers a range of preventive health benefits. Some of the tests and screenings are paid in full while other may be subject to a deductible, co-insurance, and/or copay.  Some of these benefits include a comprehensive review of your medical history including lifestyle, illnesses that may run in your family, and various assessments and screenings as appropriate.  After reviewing your medical record and screening and assessments performed today your provider may have ordered immunizations, labs, imaging, and/or referrals for you.  A list of these orders (if applicable) as well as your Preventive Care list are included within your After Visit Summary for your review.

## 2024-12-20 NOTE — Assessment & Plan Note (Signed)
 1. Annual wellness visit:  - She does not check her blood pressure at home but reports no issues such as chest pain, heart palpitations, dizziness, or leg swelling.  - She has been incorporating a blend of carrots, celery, kale, spinach, nuts, banana, apple, cherries, flaxseed, and protein powder into her diet for the past 2.5 months. She engages in regular physical activity, including yoga and core strengthening exercises at the [facility].  - A cholesterol panel will be ordered to monitor her levels, given her intolerance to statins.  - She is advised to continue her current diet and exercise regimen. An eye doctor appointment is recommended for a routine check-up.
